# Patient Record
Sex: Female | Born: 1950 | Race: White | Hispanic: No | Marital: Married | Smoking: Current every day smoker
Health system: Southern US, Community
[De-identification: ages and names within clinical notes are randomized; demographics above are authoritative.]

## PROBLEM LIST (undated history)

## (undated) DIAGNOSIS — M419 Scoliosis, unspecified: Secondary | ICD-10-CM

## (undated) DIAGNOSIS — E039 Hypothyroidism, unspecified: Secondary | ICD-10-CM

## (undated) DIAGNOSIS — M797 Fibromyalgia: Secondary | ICD-10-CM

## (undated) DIAGNOSIS — K219 Gastro-esophageal reflux disease without esophagitis: Secondary | ICD-10-CM

## (undated) DIAGNOSIS — I10 Essential (primary) hypertension: Secondary | ICD-10-CM

## (undated) HISTORY — PX: FOOT SURGERY: SHX648

## (undated) HISTORY — PX: TONSILLECTOMY: SUR1361

## (undated) HISTORY — DX: Scoliosis, unspecified: M41.9

## (undated) HISTORY — DX: Gastro-esophageal reflux disease without esophagitis: K21.9

## (undated) HISTORY — DX: Fibromyalgia: M79.7

## (undated) HISTORY — DX: Essential (primary) hypertension: I10

## (undated) HISTORY — PX: ABDOMINAL HYSTERECTOMY: SUR658

## (undated) HISTORY — PX: BREAST LUMPECTOMY: SHX2

## (undated) HISTORY — DX: Hypothyroidism, unspecified: E03.9

## (undated) HISTORY — PX: ADENOIDECTOMY: SUR15

---

## 1998-10-17 ENCOUNTER — Inpatient Hospital Stay (HOSPITAL_COMMUNITY): Admission: EM | Admit: 1998-10-17 | Discharge: 1998-10-18 | Payer: Self-pay | Admitting: Emergency Medicine

## 1998-10-17 ENCOUNTER — Encounter: Payer: Self-pay | Admitting: Emergency Medicine

## 1998-10-18 ENCOUNTER — Encounter: Payer: Self-pay | Admitting: *Deleted

## 1998-11-12 ENCOUNTER — Observation Stay (HOSPITAL_COMMUNITY): Admission: RE | Admit: 1998-11-12 | Discharge: 1998-11-12 | Payer: Self-pay | Admitting: Surgery

## 1998-11-12 ENCOUNTER — Encounter (INDEPENDENT_AMBULATORY_CARE_PROVIDER_SITE_OTHER): Payer: Self-pay | Admitting: Specialist

## 1999-03-05 ENCOUNTER — Other Ambulatory Visit: Admission: RE | Admit: 1999-03-05 | Discharge: 1999-03-05 | Payer: Self-pay | Admitting: Obstetrics and Gynecology

## 1999-04-29 ENCOUNTER — Encounter: Admission: RE | Admit: 1999-04-29 | Discharge: 1999-04-29 | Payer: Self-pay | Admitting: *Deleted

## 1999-06-01 ENCOUNTER — Encounter: Admission: RE | Admit: 1999-06-01 | Discharge: 1999-06-01 | Payer: Self-pay | Admitting: *Deleted

## 1999-07-28 ENCOUNTER — Inpatient Hospital Stay (HOSPITAL_COMMUNITY): Admission: RE | Admit: 1999-07-28 | Discharge: 1999-07-30 | Payer: Self-pay | Admitting: *Deleted

## 1999-07-29 ENCOUNTER — Encounter: Payer: Self-pay | Admitting: Neurology

## 1999-08-13 ENCOUNTER — Ambulatory Visit (HOSPITAL_BASED_OUTPATIENT_CLINIC_OR_DEPARTMENT_OTHER): Admission: RE | Admit: 1999-08-13 | Discharge: 1999-08-13 | Payer: Self-pay | Admitting: *Deleted

## 2000-01-27 ENCOUNTER — Emergency Department (HOSPITAL_COMMUNITY): Admission: EM | Admit: 2000-01-27 | Discharge: 2000-01-27 | Payer: Self-pay | Admitting: Internal Medicine

## 2000-02-08 ENCOUNTER — Encounter: Payer: Self-pay | Admitting: Gastroenterology

## 2000-02-08 ENCOUNTER — Encounter: Admission: RE | Admit: 2000-02-08 | Discharge: 2000-02-08 | Payer: Self-pay | Admitting: Gastroenterology

## 2000-03-17 ENCOUNTER — Other Ambulatory Visit: Admission: RE | Admit: 2000-03-17 | Discharge: 2000-03-17 | Payer: Self-pay | Admitting: Obstetrics and Gynecology

## 2000-03-28 ENCOUNTER — Ambulatory Visit (HOSPITAL_COMMUNITY): Admission: RE | Admit: 2000-03-28 | Discharge: 2000-03-28 | Payer: Self-pay | Admitting: Gastroenterology

## 2000-03-28 ENCOUNTER — Encounter (INDEPENDENT_AMBULATORY_CARE_PROVIDER_SITE_OTHER): Payer: Self-pay | Admitting: *Deleted

## 2000-04-07 ENCOUNTER — Encounter: Payer: Self-pay | Admitting: Obstetrics and Gynecology

## 2000-04-07 ENCOUNTER — Encounter: Admission: RE | Admit: 2000-04-07 | Discharge: 2000-04-07 | Payer: Self-pay | Admitting: Obstetrics and Gynecology

## 2000-10-19 ENCOUNTER — Encounter: Payer: Self-pay | Admitting: *Deleted

## 2000-10-19 ENCOUNTER — Ambulatory Visit (HOSPITAL_COMMUNITY): Admission: RE | Admit: 2000-10-19 | Discharge: 2000-10-19 | Payer: Self-pay | Admitting: *Deleted

## 2002-10-18 ENCOUNTER — Observation Stay (HOSPITAL_COMMUNITY): Admission: EM | Admit: 2002-10-18 | Discharge: 2002-10-19 | Payer: Self-pay | Admitting: *Deleted

## 2002-10-18 ENCOUNTER — Encounter: Payer: Self-pay | Admitting: *Deleted

## 2002-10-19 ENCOUNTER — Encounter: Payer: Self-pay | Admitting: Family Medicine

## 2002-10-24 ENCOUNTER — Encounter: Admission: RE | Admit: 2002-10-24 | Discharge: 2002-10-24 | Payer: Self-pay | Admitting: Family Medicine

## 2004-08-23 ENCOUNTER — Encounter: Admission: RE | Admit: 2004-08-23 | Discharge: 2004-08-23 | Payer: Self-pay | Admitting: Obstetrics and Gynecology

## 2004-09-02 ENCOUNTER — Encounter: Admission: RE | Admit: 2004-09-02 | Discharge: 2004-09-02 | Payer: Self-pay | Admitting: Obstetrics and Gynecology

## 2005-05-04 ENCOUNTER — Encounter (INDEPENDENT_AMBULATORY_CARE_PROVIDER_SITE_OTHER): Payer: Self-pay | Admitting: Specialist

## 2005-05-04 ENCOUNTER — Ambulatory Visit (HOSPITAL_COMMUNITY): Admission: RE | Admit: 2005-05-04 | Discharge: 2005-05-04 | Payer: Self-pay | Admitting: *Deleted

## 2005-10-11 ENCOUNTER — Encounter: Admission: RE | Admit: 2005-10-11 | Discharge: 2005-10-11 | Payer: Self-pay | Admitting: Obstetrics and Gynecology

## 2007-02-08 ENCOUNTER — Encounter
Admission: RE | Admit: 2007-02-08 | Discharge: 2007-02-08 | Payer: Self-pay | Admitting: Physical Medicine & Rehabilitation

## 2007-03-14 ENCOUNTER — Ambulatory Visit: Payer: Self-pay | Admitting: Physical Medicine & Rehabilitation

## 2007-03-14 ENCOUNTER — Encounter
Admission: RE | Admit: 2007-03-14 | Discharge: 2007-06-12 | Payer: Self-pay | Admitting: Physical Medicine & Rehabilitation

## 2007-05-28 ENCOUNTER — Ambulatory Visit: Payer: Self-pay | Admitting: Physical Medicine & Rehabilitation

## 2007-07-19 ENCOUNTER — Encounter
Admission: RE | Admit: 2007-07-19 | Discharge: 2007-10-17 | Payer: Self-pay | Admitting: Physical Medicine & Rehabilitation

## 2007-09-10 ENCOUNTER — Ambulatory Visit: Payer: Self-pay | Admitting: Physical Medicine & Rehabilitation

## 2007-11-01 ENCOUNTER — Encounter
Admission: RE | Admit: 2007-11-01 | Discharge: 2007-11-02 | Payer: Self-pay | Admitting: Physical Medicine & Rehabilitation

## 2007-11-02 ENCOUNTER — Ambulatory Visit: Payer: Self-pay | Admitting: Physical Medicine & Rehabilitation

## 2008-01-31 ENCOUNTER — Encounter
Admission: RE | Admit: 2008-01-31 | Discharge: 2008-01-31 | Payer: Self-pay | Admitting: Physical Medicine & Rehabilitation

## 2010-03-14 ENCOUNTER — Encounter: Payer: Self-pay | Admitting: Obstetrics and Gynecology

## 2010-07-06 NOTE — Assessment & Plan Note (Signed)
Candice Oliver is back regarding fibromyalgia and left hip pain.  She did not  follow up with the [facility] trial as she had some things that came up  with her family and she frankly admitted to having lost track of sample  pack.  She stated left hip injection we performed at last visit was not  quite as helpful as it had been either.  She rates her pain at 7/10 and  describes it as sharp, dull, constant and aching.  The pain interferes  with general activity, relations with others, and enjoyment of life on a  moderate level.  Sleep is poor.  She easily wakes up once in the middle  of the night.  She has to take another Lortab.  It is usually the left  hip pain that wakes her.   REVIEW OF SYSTEMS:  Notable for bladder control problems, weakness,  trouble walking, depression, anxiety, waking, nausea, vomiting,  diarrhea, constipation, and reflux.  Full review is in the written  health and history section in the chart.   SOCIAL HISTORY:  As noted above.  Apparently, her husband and sister has  been sick and this has been weighing heavily on her.   PHYSICAL EXAMINATION:  VITAL SIGNS:  Blood pressure 145/97, pulse is 73,  respiratory rate 18, and she is satting 98% on room air.  GENERAL:  The patient is pleasant, alert and oriented x3.  Strength  remains 5/5 in all 4 extremities.  She has palpable trigger points still  in the neck and shoulders.  Left greater trochanter and the external  rotator of the hip are tender to palpitation today and  reproduced her  hip pain.  HEART:  Regular.  CHEST:  Clear.  ABDOMEN:  Soft and nontender.  She remains overweight.  NEUROLOGICAL:  Cognitively, she is intact with normal cranial nerve  exam.   ASSESSMENT:  1. Fibromyalgia syndrome.  2. Left leg length discrepancy.  3. Cervical and thoracic myofascial pain.  4. Left greater trochanter bursitis.  5. Obesity.  6. Headaches.  7. Anxiety and depression.  8. Irritable bowel syndrome.   PLAN:  1. We will  retry Savella for fibromyalgia symptoms.  2. Few Lortab for breakthrough pain.  3. We injected left hip posteriorly along the greater trochanter and      insertion of the rotators with 40 mg of Kenalog and 3 mL 1%      lidocaine.  The patient tolerated well.  Discussed aggressive      stretching and range of      motion which she is doing to a certain extent at the home.  4. I will see her back in about 2 months.      Ranelle Oyster, M.D.  Electronically Signed     ZTS/MedQ  D:  11/02/2007 13:12:15  T:  11/03/2007 02:20:15  Job #:  [id number]   cc:   Clydie Braun L. Hal Hope, M.D.  Fax: [phone]

## 2010-07-06 NOTE — Assessment & Plan Note (Signed)
HISTORY:  Candice Oliver is back regarding her fibromyalgia.  She missed her  last appointment after she placed it on the wrong date on her calendar.  She complains of more hip pain, particularly on the left side.  She had  a fall over the weekend, which exacerbated symptoms.  She has been busy  in the yard, working, exercising, is very active.  She has been canning  a lot of the vegetables, and her neck has become sore as a result.  She  rates her pain a 5/10.  She uses Lortab for breakthrough pain and feels  this is not helping as much as they once did.  She stopped Celebrex and  Cymbalta due to diarrhea.  Her irritable bowel and diarrhea symptoms  have been a bit better overall.  The patient states her pain is  interfering with general activity, in relations with others, and  enjoyment of life on a moderate-to-severe level.  The areas of pain  include left shoulder, the interscapular region, and hips.   REVIEW OF SYSTEMS:  Notable for above as well as some depression,  anxiety, nausea, diarrhea, constipation, and poor appetite.  Full review  is in the written health and history section of the chart.  Other  pertinent positives are above.   SOCIAL HISTORY:  As noted above.  She did lose her job which she feels  may have been a blessing in disguise.   PHYSICAL EXAMINATION:  VITAL SIGNS:  Blood pressure is 143/83, pulse is  97, respiratory rate 24, and she is sating 97% on room air.  GENERAL:  The patient is pleasant, alert, and oriented x3.  She remains  overweight.  She has pain in the left greater trochanter region with  palpation.  Strength generally is 5/5 with normal sensation.  She had  palpable trigger points along the left trapezius and left  sternocleidomastoid today with radiation into the chest and scapular  areas.  HEART:  Regular.  CHEST:  Clear.  ABDOMEN:  Soft and nontender.  NEURO:  Cranial nerve exam is intact.  Coordination is stable.   ASSESSMENT:  1. Fibromyalgia  syndrome.  2. Left leg length discrepancy.  3. Cervical and thoracic myofascial pain.  4. Left greater trochanter bursitis.  5. Obesity.  6. Headaches.  7. Anxiety with depression.  8. Irritable bowel syndrome.   PLAN:  1. Begin trial of Savella for fibromyalgia pain.  We will review the      facts and side effects today.  2. Encourage use of ReQuip 4-6 mg at nighttime for sleep symptoms.  If      this does not help, we will need to look at stopping or  trying a      different agent.  3. Refilled Lortab today.  4. After informed consent, reinjected the left hip with 40 mg of      Kenalog and 3 mL of Lidocaine.  5. We injected the left trapezius and sternocleidomastoid at two      locations with 2 mL of 1% Lidocaine trigger point injections.  6. Encouraged exercise.  Activity as tolerated.  7. Recommended low-residue fiber supplementation.  8. We will see her back in about a month.      Ranelle Oyster, M.D.  Electronically Signed     ZTS/MedQ  D:  09/10/2007 11:30:18  T:  09/11/2007 04:51:51  Job #:  [id number]   cc:   Clydie Braun L. Hal Hope, M.D.  Fax: [phone]

## 2010-07-06 NOTE — Assessment & Plan Note (Signed)
Candice Oliver is back regarding her fibromyalgia, neck and leg pain. She  liked the Requip and it has helped some of her restless leg symptoms and  sleep, although she ran out of her Halcion about a week and a half ago  and her sleep has been affected adversely. She stopped the Cymbalta  initially after we told her to hold off when she experienced lower  extremity swelling. She however resumed the medication because it helped  her substantially with her energy and mood. She uses Psychologist, sport and exercise as  prescribed. She has gone back on Lyrica for general pain, but more so  the left hip symptoms. She was not able to get into therapy due to a $50  co-pay being prohibitive.   The patient rates her pain as a 6/10 today. She describes it as a  burning, dull, constant and aching. Pain interferes with general  activity, relations with others and enjoyment of life on a moderate  level. Sleep is fair.   REVIEW OF SYSTEMS:  Notable for depression and anxiety, nausea,  diarrhea, some constipation, weight gain, bladder and bowel symptoms.  Other pertinent positives are listed above and full review is in written  Health and History section of the chart.   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   PHYSICAL EXAMINATION:  Blood pressure 147/89, pulse 88. Respiratory  rate: 18. She is satting 94% on room air. The patient is pleasant, alert  and oriented x3. Affect is bright and appropriate. She remains  overweight. She has some pain still in the lower cervical and upper  thoracic regions, left greater trochanter is significantly tender to  touch both anteriorly and posteriorly. She has intact strength at 5/5  with normal sensory examination. Cognitively she is appropriate.  HEART: Regular.  CHEST: Clear.  ABDOMEN: Soft and nontender.   ASSESSMENT:  1. Fibromyalgia syndrome.  2. Left leg length discrepancy, improved with heel lift.  3. Thoracic and lower  cervical myofascial pain.  4. Left greater trochanter bursitis.  5. Obesity.  6. History of headaches.  7. Anxiety and depression.   PLAN:  1. Will increase Requip to 1 mg to see if we can avoid restarting      Halcion.  2. Refill Lortab today #100.  3. Continue Cymbalta 60 mg daily.  4. After informed consent, we injected the left hip via the lateral      approach using 40 mg of Kenalog and 3 cc of 1% lidocaine. The      patient tolerated well. Advised ice. We also will start Celebrex      200 mg daily. She was given samples to start. I want her to obtain      some hip exercises from her therapy cohorts to work on the      hamstring musculature, quadriceps, adductors and tensor fascia      latae muscles.  5. I would like her to wean herself off of Lyrica due to ill effects.      I am not sure it is helping her in a general sense. I believe we      can treat the left hip as above.  6. Continue appropriate shoe wear and heel lift.  7. I will see her back in about two months time. I feel that we are      making some progress here.      Ranelle Oyster, M.D.  Electronically Signed  ZTS/MedQ  D:  04/11/2007 10:49:53  T:  04/11/2007 19:08:58  Job #:  [id number]   cc:   Clydie Braun L. Hal Hope, M.D.  Fax: [phone]

## 2010-07-06 NOTE — Assessment & Plan Note (Signed)
Candice Oliver is back regarding her fibromyalgia.  She did very well with her  left hip injection for about 1 to 1-1/2 months.  The pain seems to have  flared up again.  Requip has helped a bit with her sleep but she still  wakes up at night.  She complains of increasing headaches, particularly  in the frontal areas.  She has had some sinus issues along with those.  She is taking Mucinex for cough and as an expectorant.  She feels  overall more achy.  She has had 3-4 weeks of diarrhea.  She has had a  workup underway.  She sees a GI specialist tomorrow.   Incidentally, we resumed Cymbalta and started Celebrex last visit.  The  patient has stopped Lyrica.   The patient rates her pain as a 6-7 out of 10.  She describes it as  sharp, dull, constant, sometimes aching.  The pain interferes with  general activity, relations with others, enjoyment of life on a moderate  level.  Sleep is fair.   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   REVIEW OF SYSTEMS:  Notable for the above as well as depression,  anxiety, nausea, diarrhea, limb swelling, abdominal pain.   PHYSICAL EXAMINATION:  VITAL SIGNS:  Blood pressure is 142/93, pulse is  86, respiratory rate 18.  She is sating 94% on room air.  GENERAL:  The patient is pleasant, alert, and oriented x3.  She remains  overweight.  MUSCULOSKELETAL:  She has continued pain in the left greater trochanter  region, particularly to touch and range of motion today.  Strength  appeared at 5/5.  Normal sensation.  Cognitively she is appropriate.  HEART:  Regular.  CHEST:  Clear.  ABDOMEN:  Soft, nontender.   ASSESSMENT:  1. Fibromyalgia Synthroid.  2. Left leg length discrepancy.  3. Thoracic and lower cervical myofascial pain.  4. Left greater trochanter bursitis.  5. Obesity.  6. History of headaches.  7. Anxiety and depression.  8. Diarrhea most likely related to  irritable bowel syndrome and      exacerbation with current medication changes.   PLAN:  1. Hold Cymbalta and Celebrex today due to diarrhea.  2. We will increase Requip to 2 mg nightly.  The patient may take an      additional milligram to 2 milligrams if needed.  3. Refill Lortab #100.  4. After informed consent, we injected the left greater trochanter      using 40 mg of Kenalog and 3 mL 1% lidocaine.  The patient      tolerated well.  5. Discussed using over-the-counter antihistamines for potential sinus      allergies.  These certainly could be exacerbating headache      symptoms.  6. Await GI recommendations regarding her diarrhea.  7. I will see her back in 1 to 2 months' time.      Ranelle Oyster, M.D.  Electronically Signed     ZTS/MedQ  D:  05/28/2007 16:31:19  T:  05/28/2007 17:51:08  Job #:  [id number]   cc:   Clydie Braun L. Hal Hope, M.D.  Fax: [phone]

## 2010-07-09 NOTE — Discharge Summary (Signed)
[facility]  Patient:    Candice Oliver, Candice Oliver                    MRN: [id number] Adm. Date:  [id number] Disc. Date: [id number] Attending:  Feliciana Rossetti                           Discharge Summary  DISCHARGE DIAGNOSES: 1. Syncope and collapse. 2. Fibromyalgia. 3. Carpal tunnel syndrome. 4. Hypothyroidism. 5. Hypertension. 6. Elevated liver function tests.  REASON FOR HOSPITALIZATION:  Syncope and collapse.  HOSPITAL COURSE:  Patient was admitted to a general medical bed and consultation was obtained from Dr. Genene Churn. Love.  MRI was performed of brain and MRA of head, which were negative, without acute disease.  Patient was continued on her ______ 0.125 mg q.d., Remeron 15 mg q.h.s., Wellbutrin 150 mg b.i.d. and Maxzide 75 mg q.d.  She was hydrated cautiously.  Demerol and Phenergan were given IV as necessary for pain.  Erythrocyte sedimentation rate, ANA, CPK and ALT were obtained in addition to a comprehensive metabolic panel and a complete blood cell count.  All of this was unrevealing and patient had no further syncopal episodes and no presyncopal episodes.  CONDITION AT DISCHARGE:  Patient was well-appearing, ambulating with ease, stating she feel poorly but at her baseline.  Her vital signs were stable and normal.  FOLLOWUP:  Followup was with Dr. Feliciana Rossetti two weeks following discharge for a planned repeat of ______  LFTs. DD:  09/12/99 TD:  09/15/99 Job: [id number]

## 2010-07-09 NOTE — Op Note (Signed)
Candice Oliver, Candice Oliver             ACCOUNT NO.:  [id number]   MEDICAL RECORD NO.:  [id number]          PATIENT TYPE:  AMB   LOCATION:  ENDO                         FACILITY:  [facility]   PHYSICIAN:  Anselmo Rod, M.D.  DATE OF BIRTH:  [birth year]   DATE OF PROCEDURE:  05/05/2005  DATE OF DISCHARGE:                                 OPERATIVE REPORT   PROCEDURE PERFORMED:  Colonoscopy with multiple rectal biopsies.   ENDOSCOPIST:  Charna Elizabeth, M.D.   INSTRUMENT USED:  Olympus video colonoscope.   INDICATIONS FOR PROCEDURE:  A 60 year old white female with history of  change in bowel habits, mucoid stool and family history of colon cancer in  her mother and her maternal uncle, undergoing colonoscopy to rule out  colonic polyps, masses, etc.   PREPROCEDURE PREPARATION:  Informed consent was procured from the patient.  The patient was fasted for four hours prior to the procedure and prepped  with OsmoPrep pills the night of and the morning of the procedure.  The  risks and benefits of the procedure including a 10% miss rate for cancer or  polyps was discussed with the patient as well.   PREPROCEDURE PHYSICAL:  The patient had stable vital signs.  Neck supple.  Chest clear to auscultation.  S1 and S2 regular.  Abdomen soft with normal  bowel sounds.   DESCRIPTION OF PROCEDURE:  The patient was placed in left lateral decubitus  position and sedated with 100 mcg of fentanyl and 10 mg of Versed in slow  incremental doses.  Once the patient was adequately sedated and maintained  on low flow oxygen and continuous cardiac monitoring, the Olympus video  colonoscope was advanced from the rectum to the cecum.  The appendicular  orifice and ileocecal valve were clearly visualized and photographed.  There  was a large amount of residual stool in the colon.  Multiple washes were  done.  The patient's position was changed from the left lateral to supine  position with gentle application of abdominal  pressure to reach the cecal  base.  Three small sessile polyps were biopsied from the rectum.  Rectal  biopsies were done as there was some loss of vascular marking in this area,  to rule out proctitis as well.  Mild inflammatory changes were noted from 0  to 5 cm.  The rest of the exam up to the terminal ileum appeared normal.  Small lesions could be missed as the prep was poor.   IMPRESSION:  Three small sessile polyps biopsied from the rectum along with  rectal biopsies to rule out proctitis.  Otherwise unrevealing exam up to the  terminal ileum.   RECOMMENDATIONS:  1.  Await pathology results.  2.  Avoid all nonsteroidals for now.  3.  Outpatient followup in the next two weeks for further recommendations.      Anselmo Rod, M.D.  Electronically Signed     JNM/MEDQ  D:  05/05/2005  T:  05/06/2005  Job:  [id number]   cc:   Nilda Simmer, M.D.  Fax: [phone]

## 2010-07-09 NOTE — H&P (Signed)
[facility]  Patient:    Candice Oliver, Candice Oliver                    MRN: [id number] Adm. Date:  [id number] Disc. Date: [id number] Attending:  Feliciana Rossetti                         History and Physical  CHIEF COMPLAINT:  Passed out.  HISTORY OF PRESENT ILLNESS:  A 60 year old white female was walking today at which time she felt light headed and then fell to the ground. She incurred no trauma. She denied palpitations, chest pain, fevers, chills, nausea, vomiting, diarrhea, dysuria. She does endorse diffuse body aches, diffuse muscle aches and diffuse joint aches throughout her body. These included her bilateral shoulders, bilateral hips, bilateral knees, bilateral ankles, bilateral hands and again diffusely her musculature. She describes headaches that are typical in intensity and character and they are 10/10 throbbing occipital associated with photophobia and phonophobia and are associated with nausea and vomiting. She denies anxiety, bizarre thoughts. She denies rash. She does endorse a recent tick bite for which she presented to Urgent Care and Dr. Perrin Maltese treated her with doxycycline.  DATA:  GGT greater elevated in my office. Past GGT within normal limits. Recent sed rate at Urgent Care normal. ANA negative. CBC, diff and CMET within normal limits. Hepatitis panel negative.  PAST SURGICAL HISTORY:  Cholecystectomy, hysterectomy, lumpectomy, tonsillectomy, wrist surgery.  PAST MEDICAL HISTORY:  Fibromyalgia, hypothyroidism, hypertension.  FAMILY HISTORY:  No collagen vascular disease. No muscular dystrophy, no myasthenia gravida. No Parkinsons. No known neurologic disease.  REVIEW OF SYSTEMS:  No skin breakdown. No rash. No overseas travel.  SOCIAL HISTORY:  Lives with husband. No tobacco, no ethanol.  MEDICINES:  Unithroid 0.125, Wellbutrin 150 b.i.d., Remeron 15 q.h.s., Maxzide 75 q.d., ______ q.i.d., Imitrex injection p.r.n., Lorcet  p.r.n.  ALLERGIES:  PENICILLIN.  PHYSICAL EXAMINATION:  GENERAL:  Nontoxic in no acute distress.  HEENT:  Pupils equal round and reactive to light. Funduscopic exam reveals no exudate or hemorrhage and normal disk edges. Oropharynx without lesions. Mucous membranes are moist.  NECK:  Supple. No carotid bruits. Lymph node survey negative to neck and inguinal and axillary.  HEART:  Regular rate and rhythm, no murmurs, rubs or gallops.  LUNGS:  Clear to auscultation. Good symmetric air movement.  ABDOMEN:  Soft, no hepatosplenomegaly. Obese. No rebound, no guard. No guard, normal bowel sounds.  EXTREMITIES:  No clubbing, cyanosis. Minimal non-pitting edema that is symmetric and bilateral. No cords or Homans.  NEUROLOGIC:  Muscle strength within normal limits. Rapid alternating movement and gait within normal limits.  ASSESSMENT: 1. Syncope: Consider Holter monitor, consider cardiac evaluation. 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 2. Fibromyalgia:  Continue Wellbutrin and remeron. Consider discontinuation    of Wellbutrin and resume serotonin elevating drug given agitation. 3. Hypothyroidism: Unithroid. 4. Hypertension:  Maxzide. 5. Migraine headaches:  The patient has been to [facility].    Will obtain neurologic consultation. DD:  07/28/99 TD:  07/29/99 Job: [id number]

## 2010-07-09 NOTE — Consult Note (Signed)
[facility]  Patient:    Candice Oliver, Candice Oliver                    MRN: [id number] Proc. Date: 07/29/99 Adm. Date:  [id number] Attending:  Feliciana Rossetti                          Consultation Report  DATE OF BIRTH:  04/17/1950  REASON FOR CONSULTATION:  This 60 year old right handed white married female is admitted from the office by Dr. Quintella Reichert on the evening of July 28, 1999 and is now evaluated for episodes of presyncope.  HISTORY OF PRESENT ILLNESS:  Candice Oliver has a 3 year history of suspected fibromyalgia characterized by pain syndrome involving the upper and lower extremities as a muscle cramp of soft tissue type of pain with swelling in her joints. This has involved the right hand and wrist more recently and she has no known history of carpal tunnel syndrome though she has been on a thyroid replacement medication. She states that over the last 3 months, she has had almost daily episodes occurring as often as once to twice per day of standing dizziness characterized by light headed sensation without true spinning, nausea and vomiting, tinnitus, chest pain or palpitations. Yesterday while shopping, she noted the onset of this light headed sensation and had to sit down for relief and although there was near syncope, there was no definite loss of consciousness. There has been no history of micropsia or macropsia deja vu, strange odors or taste, head or neck trauma.  MEDICATIONS:  She has been on a series of medications for her disorder including Wellbutrin 150 mg p.o. b.i.d., Maxzide 75 one p.o. q.d., Remeron 15 mg 1 p.o. q.h.s., Unithroid 0.125 mg q.d., hydrocodone 3-4 per day and soma for muscle spasms.  PAST MEDICAL HISTORY:  She has had a known history in the past of high blood pressure. She has not had a known history of diabetes or heart disease according to the patient. She states that otherwise her health has been fairly good. 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 She does not smoke cigarettes. She does not drink alcohol.  LABORATORY DATA:  Her laboratory studies to date in the hospital have included hemoglobin of 12.5, hematocrit 37.2, white blood cell count 8000, platelets 387K, glucose 129, BUN 12, sodium 135, potassium 3.8, chloride 105, CO2 content 32, CK of 111.  PHYSICAL EXAMINATION:  GENERAL:  Revealed a well-developed, obese white female in no acute distress.  VITAL SIGNS:  Blood pressure lying right and left arm of 110/60, standing in the right arm 120/60, heart rate was 100 and regular.  NECK:  There were no carotid or supraclavicular bruits heard. The neck flexion and extension maneuvers were unremarkable.  HEENT:  She had a previous injury to her lip. Tympanic membranes were clear. There was no evidence of any easy bruisability or ecchymoses noted.  MENTAL STATUS:  She was alert and oriented x 3. She followed 1, 2, and 3 step commands. Her visual fields were full. The disks were flat. The extraocular movements full and cornuals were present. 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 Hearing was present. Air conduction was greater than bone conduction. The tongue was midline. The uvula was midline and gags were present. Sternocleidomastoid and trapezius were normal.  MOTOR EXAMINATION:  Revealed fair effort generally except for decreased effort in the right hand and arm  secondary to pain. There was no evidence of any atrophy seen, no fasciculations were present. It was felt that her examination was essentially nonfocal in terms of motor function. The sensory examination was intact to pinprick, light touch, joint position and vibration testing. The deep tendon reflexes were 2+ and plantar responses were down going. Gait examination was within normal limits. She could stand on her toes and she could stand on her heels.  Further information from the patient indicates episodes of confusion at  times occurring with a light headed sensation. This is not necessarily associated with macropsia or micropsia.  IMPRESSION: 1. Presyncope, code 780.2. 2. Confusion, code 298.9. 3. Fibromyalgia, code 729.1. 4. Carpal tunnel syndrome, code 354.0. 5. Thyroid disease.  PLAN:  Obtain an MRI study and EEG of the brain and try her on wrist splints. Consider a tilt table test to look for dysautonomia and signs of drop in blood pressure as a possible explanation for her dizziness. The possibility of a neurocardiogenic syncope needs to be considered. DD:  07/29/99 TD:  07/29/99 Job: [id number]

## 2010-07-09 NOTE — Procedures (Signed)
[place]. [facility]  Patient:    Candice Oliver, Candice Oliver                    MRN: [id number] Proc. Date: 03/28/00 Adm. Date:  [id number] Attending:  Charna Elizabeth CC:         Lacretia Leigh. Quintella Reichert, M.D.  Malachi Pro. Ambrose Mantle, M.D.   Procedure Report  DATE OF BIRTH:  02/25/1950.  PROCEDURE:  Colonoscopy with hot biopsy x 1.  ENDOSCOPIST:  Anselmo Rod, M.D.  INSTRUMENT USED:  Olympus video colonoscope.  INDICATION FOR PROCEDURE:  Guaiac-positive stool and a family history of colon cancer in a 60 year old white female.  Rule out colonic polyps, masses, hemorrhoids, etc.  PREPROCEDURE PREPARATION:  Informed consent was procured from the patient. The patient was fasted for eight hours prior to the procedure and prepped with a bottle of magnesium citrate and a gallon of NuLytely the night prior to the procedure.  PREPROCEDURE PHYSICAL:  VITAL SIGNS:  The patient had stable vital signs.  NECK:  Supple.  CHEST:  Clear to auscultation.  S1, S2 regular.  ABDOMEN:  Soft with normal abdominal bowel sounds.  DESCRIPTION OF PROCEDURE:  The patient was placed in the left lateral decubitus position and sedated with an additional 20 mg of Demerol and 2 mg of Versed intravenously.  Once the patient was adequately sedate and maintained on low-flow oxygen and continuous cardiac monitoring, the Olympus video colonoscope was advanced from the rectum to the cecum without difficulty.  The patient had a fairly good prep.  A small sessile polyp was hot biopsied from 20 cm.  The procedure was complete to the cecum.  The ileocecal valve and appendiceal orifice were clearly visualized and photographed.  No masses, polyps, erosions, or diverticula were seen in the cecum, right colon, transverse colon, or proximal left colon.  The patient had small internal and external hemorrhoid and tolerated the procedure well without complication.  IMPRESSION: 1. Essentially  healthy-appearing colon except for small sessile polyp, hot    biopsied at 20 cm. 2. Small, nonbleeding internal and external hemorrhoid.  RECOMMENDATIONS: 1. Await pathology results. 2. Avoid all nonsteroidals including aspirin. 3. Repeat LFTs today. 4. Further recommendation on an outpatient basis in the office in the next    four weeks. DD:  03/28/00 TD:  03/28/00 Job: [id number]

## 2012-05-28 ENCOUNTER — Other Ambulatory Visit: Payer: Self-pay

## 2012-05-28 DIAGNOSIS — Z1231 Encounter for screening mammogram for malignant neoplasm of breast: Secondary | ICD-10-CM

## 2012-05-30 ENCOUNTER — Ambulatory Visit: Payer: Self-pay

## 2012-06-27 ENCOUNTER — Ambulatory Visit: Payer: Self-pay

## 2012-06-27 ENCOUNTER — Ambulatory Visit
Admission: RE | Admit: 2012-06-27 | Discharge: 2012-06-27 | Disposition: A | Discharge status: Home / Self Care | Payer: Medicare Other | Source: Ambulatory Visit

## 2012-06-27 DIAGNOSIS — Z1231 Encounter for screening mammogram for malignant neoplasm of breast: Secondary | ICD-10-CM

## 2012-10-29 ENCOUNTER — Other Ambulatory Visit: Payer: Self-pay | Admitting: Family Medicine

## 2012-10-29 DIAGNOSIS — M549 Dorsalgia, unspecified: Secondary | ICD-10-CM

## 2012-11-03 ENCOUNTER — Other Ambulatory Visit: Payer: Medicare Other

## 2012-11-15 ENCOUNTER — Ambulatory Visit
Admission: RE | Admit: 2012-11-15 | Discharge: 2012-11-15 | Disposition: A | Discharge status: Home / Self Care | Payer: Medicare Other | Source: Ambulatory Visit | Attending: Family Medicine | Admitting: Family Medicine

## 2012-11-15 DIAGNOSIS — M549 Dorsalgia, unspecified: Secondary | ICD-10-CM

## 2014-06-23 ENCOUNTER — Other Ambulatory Visit: Payer: Self-pay

## 2014-06-23 DIAGNOSIS — Z1231 Encounter for screening mammogram for malignant neoplasm of breast: Secondary | ICD-10-CM

## 2014-06-23 DIAGNOSIS — Z803 Family history of malignant neoplasm of breast: Secondary | ICD-10-CM

## 2014-06-25 ENCOUNTER — Ambulatory Visit
Admission: RE | Admit: 2014-06-25 | Discharge: 2014-06-25 | Disposition: A | Discharge status: Home / Self Care | Payer: Medicare PPO | Source: Ambulatory Visit

## 2014-06-25 ENCOUNTER — Ambulatory Visit: Payer: Medicare Other

## 2014-06-25 ENCOUNTER — Encounter (INDEPENDENT_AMBULATORY_CARE_PROVIDER_SITE_OTHER): Payer: Self-pay

## 2014-06-25 DIAGNOSIS — Z1231 Encounter for screening mammogram for malignant neoplasm of breast: Secondary | ICD-10-CM

## 2014-06-25 DIAGNOSIS — Z803 Family history of malignant neoplasm of breast: Secondary | ICD-10-CM

## 2015-05-04 ENCOUNTER — Other Ambulatory Visit: Payer: Self-pay | Admitting: Family Medicine

## 2015-05-04 DIAGNOSIS — R110 Nausea: Secondary | ICD-10-CM

## 2015-05-04 DIAGNOSIS — R102 Pelvic and perineal pain: Secondary | ICD-10-CM

## 2015-05-08 ENCOUNTER — Other Ambulatory Visit: Payer: Medicare PPO

## 2016-03-31 ENCOUNTER — Other Ambulatory Visit: Payer: Self-pay | Admitting: Obstetrics and Gynecology

## 2016-03-31 DIAGNOSIS — Z1231 Encounter for screening mammogram for malignant neoplasm of breast: Secondary | ICD-10-CM

## 2016-04-04 ENCOUNTER — Ambulatory Visit: Payer: Medicare PPO

## 2016-04-26 ENCOUNTER — Other Ambulatory Visit (HOSPITAL_BASED_OUTPATIENT_CLINIC_OR_DEPARTMENT_OTHER): Payer: Self-pay

## 2016-04-26 DIAGNOSIS — G4730 Sleep apnea, unspecified: Secondary | ICD-10-CM

## 2016-05-04 DIAGNOSIS — M65341 Trigger finger, right ring finger: Secondary | ICD-10-CM | POA: Insufficient documentation

## 2016-05-18 ENCOUNTER — Ambulatory Visit (HOSPITAL_BASED_OUTPATIENT_CLINIC_OR_DEPARTMENT_OTHER): Payer: Medicare Other | Attending: Family Medicine | Admitting: Internal Medicine

## 2016-05-18 DIAGNOSIS — Z6835 Body mass index (BMI) 35.0-35.9, adult: Secondary | ICD-10-CM | POA: Insufficient documentation

## 2016-05-18 DIAGNOSIS — G4733 Obstructive sleep apnea (adult) (pediatric): Secondary | ICD-10-CM | POA: Insufficient documentation

## 2016-05-18 DIAGNOSIS — E669 Obesity, unspecified: Secondary | ICD-10-CM | POA: Diagnosis not present

## 2016-05-18 DIAGNOSIS — R0989 Other specified symptoms and signs involving the circulatory and respiratory systems: Secondary | ICD-10-CM | POA: Diagnosis present

## 2016-05-18 DIAGNOSIS — I10 Essential (primary) hypertension: Secondary | ICD-10-CM | POA: Insufficient documentation

## 2016-05-18 DIAGNOSIS — G4730 Sleep apnea, unspecified: Secondary | ICD-10-CM

## 2016-05-21 DIAGNOSIS — G4730 Sleep apnea, unspecified: Secondary | ICD-10-CM | POA: Diagnosis not present

## 2016-05-21 NOTE — Procedures (Signed)
  Patient Name: Candice Oliver, [name] Date: 05/18/2016 Gender: Female D.O.B: April 24, 1950 Age (years): 65 Referring Provider: Windle Guard Height (inches): 60 Interpreting Physician: Jetty Duhamel MD, ABSM Weight (lbs): 180 RPSGT: Ulyess Mort BMI: 35 MRN: [id number] Neck Size: 14.00 CLINICAL INFORMATION Sleep Study Type: NPSG  Indication for sleep study: Fatigue, Hypertension, Obesity, Snoring, Witnessed Apneas  Epworth Sleepiness Score: 3  SLEEP STUDY TECHNIQUE As per the AASM Manual for the Scoring of Sleep and Associated Events v2.3 (April 2016) with a hypopnea requiring 4% desaturations.  The channels recorded and monitored were frontal, central and occipital EEG, electrooculogram (EOG), submentalis EMG (chin), nasal and oral airflow, thoracic and abdominal wall motion, anterior tibialis EMG, snore microphone, electrocardiogram, and pulse oximetry.  MEDICATIONS Medications self-administered by patient taken the night of the study : none reported  SLEEP ARCHITECTURE The study was initiated at 10:08:50 PM and ended at 4:48:23 AM.  Sleep onset time was 19.8 minutes and the sleep efficiency was 89.0%. The total sleep time was 355.5 minutes.  Stage REM latency was 174.5 minutes.  The patient spent 11.25% of the night in stage N1 sleep, 75.39% in stage N2 sleep, 0.00% in stage N3 and 13.36% in REM.  Alpha intrusion was absent.  Supine sleep was 6.85%.  RESPIRATORY PARAMETERS The overall apnea/hypopnea index (AHI) was 7.1 per hour. There were 7 total apneas, including 1 obstructive, 6 central and 0 mixed apneas. There were 35 hypopneas and 42 RERAs.  The AHI during Stage REM sleep was 17.7 per hour.  AHI while supine was 9.9 per hour.  The mean oxygen saturation was 93.58%. The minimum SpO2 during sleep was 86.00%.  Soft snoring was noted during this study.  CARDIAC DATA The 2 lead EKG demonstrated sinus rhythm. The mean heart rate was 68.21 beats per minute.  Other EKG findings include: None.  LEG MOVEMENT DATA The total PLMS were 28 with a resulting PLMS index of 4.73. Associated arousal with leg movement index was 0.7 .  IMPRESSIONS - Mild obstructive sleep apnea occurred during this study (AHI = 7.1/h). - No significant central sleep apnea occurred during this study (CAI = 1.0/h). - Mild oxygen desaturation was noted during this study (Min O2 = 86.00%). - The patient snored with Soft snoring volume. - No cardiac abnormalities were noted during this study. - Clinically significant periodic limb movements did not occur during sleep. No significant associated arousals.  DIAGNOSIS - Obstructive Sleep Apnea (327.23 [G47.33 ICD-10]  RECOMMENDATIONS - Very mild obstructive sleep apnea. Return to provider to discuss treatment options.  - Be careful with alcohol, sedatives and other CNS depressants that may worsen sleep apnea and disrupt normal sleep architecture. - Sleep hygiene should be reviewed to assess factors that may improve sleep quality. - Weight management and regular exercise should be initiated or continued if appropriate.  [Electronically signed] 05/21/2016 10:32 AM  Jetty Duhamel MD, ABSM Diplomate, American Board of Sleep Medicine   NPI: [id number]  Waymon Budge Diplomate, American Board of Sleep Medicine  ELECTRONICALLY SIGNED ON:  05/21/2016, 10:32 AM [facility] PH: [phone]   FX: [phone] ACCREDITED BY THE AMERICAN ACADEMY OF SLEEP MEDICINE

## 2016-06-13 ENCOUNTER — Encounter (HOSPITAL_BASED_OUTPATIENT_CLINIC_OR_DEPARTMENT_OTHER): Payer: Medicare PPO

## 2016-09-08 ENCOUNTER — Other Ambulatory Visit: Payer: Self-pay | Admitting: Family Medicine

## 2016-09-08 ENCOUNTER — Other Ambulatory Visit: Payer: Self-pay

## 2016-09-08 DIAGNOSIS — R7989 Other specified abnormal findings of blood chemistry: Secondary | ICD-10-CM

## 2016-09-08 DIAGNOSIS — R945 Abnormal results of liver function studies: Secondary | ICD-10-CM

## 2016-09-20 ENCOUNTER — Other Ambulatory Visit: Payer: Medicare Other

## 2016-09-26 DIAGNOSIS — G5603 Carpal tunnel syndrome, bilateral upper limbs: Secondary | ICD-10-CM | POA: Insufficient documentation

## 2016-09-27 ENCOUNTER — Other Ambulatory Visit: Payer: Medicare Other

## 2016-11-29 ENCOUNTER — Other Ambulatory Visit: Payer: Self-pay | Admitting: Family Medicine

## 2016-11-29 DIAGNOSIS — R945 Abnormal results of liver function studies: Secondary | ICD-10-CM

## 2016-11-29 DIAGNOSIS — R7989 Other specified abnormal findings of blood chemistry: Secondary | ICD-10-CM

## 2016-12-06 ENCOUNTER — Other Ambulatory Visit: Payer: Medicare Other

## 2016-12-15 ENCOUNTER — Other Ambulatory Visit: Payer: Medicare Other

## 2016-12-27 ENCOUNTER — Other Ambulatory Visit: Payer: Medicare Other

## 2016-12-30 ENCOUNTER — Institutional Professional Consult (permissible substitution): Payer: Medicare Other | Admitting: Pulmonary Disease

## 2017-01-25 ENCOUNTER — Other Ambulatory Visit: Payer: Medicare Other

## 2017-02-02 ENCOUNTER — Other Ambulatory Visit: Payer: Medicare Other

## 2017-02-09 ENCOUNTER — Other Ambulatory Visit: Payer: Medicare Other

## 2018-09-13 DIAGNOSIS — I10 Essential (primary) hypertension: Secondary | ICD-10-CM | POA: Insufficient documentation

## 2018-09-13 DIAGNOSIS — M419 Scoliosis, unspecified: Secondary | ICD-10-CM | POA: Insufficient documentation

## 2018-09-13 DIAGNOSIS — M797 Fibromyalgia: Secondary | ICD-10-CM | POA: Insufficient documentation

## 2018-09-14 ENCOUNTER — Other Ambulatory Visit: Payer: Self-pay

## 2018-09-14 ENCOUNTER — Ambulatory Visit: Payer: Medicare Other | Admitting: Podiatry

## 2018-09-14 ENCOUNTER — Ambulatory Visit (INDEPENDENT_AMBULATORY_CARE_PROVIDER_SITE_OTHER): Payer: Medicare Other

## 2018-09-14 ENCOUNTER — Encounter: Payer: Self-pay | Admitting: Podiatry

## 2018-09-14 VITALS — Temp 97.8°F

## 2018-09-14 DIAGNOSIS — M7662 Achilles tendinitis, left leg: Secondary | ICD-10-CM

## 2018-09-14 MED ORDER — METHYLPREDNISOLONE 4 MG PO TBPK
ORAL_TABLET | ORAL | 0 refills | Status: DC
Start: 2018-09-14 — End: 2019-06-03

## 2018-09-18 ENCOUNTER — Telehealth: Payer: Self-pay | Admitting: *Deleted

## 2018-09-18 NOTE — Telephone Encounter (Signed)
Pt called states the compression sock Dr. Amalia Hailey gave last week is making the tendon hurt worse and she took it off. 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

## 2018-09-18 NOTE — Telephone Encounter (Signed)
I called pt and told her to rest, ice and elevate until she could get in to the [place] office for the CAM boot tomorrow.

## 2018-09-20 NOTE — Telephone Encounter (Signed)
Patient picked up boot in [place] office

## 2018-09-21 NOTE — Progress Notes (Signed)
   HPI: 68 year old female presenting today as a new patient with a chief complaint of constant dull, aching pain noted to the left Achilles tendon that began 5-6 months ago. She states the pain is worse by the end of the day after being on the foot. She has not had any treatment for the symptoms. She denies heel pain. Patient is here for further evaluation and treatment.   No past medical history on file.    Physical Exam: General: The patient is alert and oriented x3 in no acute distress.  Dermatology: Skin is warm, dry and supple bilateral lower extremities. Negative for open lesions or macerations.  Vascular: Palpable pedal pulses bilaterally. No edema or erythema noted. Capillary refill within normal limits.  Neurological: Epicritic and protective threshold grossly intact bilaterally.   Musculoskeletal Exam: Pain on palpation noted to the posterior tubercle of the left calcaneus at the insertion of the Achilles tendon consistent with retrocalcaneal bursitis. Palpable nodule noted approximately 5 cm proximal to insertion with pain with palpation. Range of motion within normal limits. Muscle strength 5/5 in all muscle groups bilateral lower extremities.  Radiographic Exam:  Posterior calcaneal spur noted to the respective calcaneus on lateral view. No fracture or dislocation noted. Normal osseous mineralization noted.     Assessment: 1. Insertional Achilles tendinitis left 2. Retrocalcaneal bursitis   Plan of Care:  1. Patient was evaluated. Radiographs were reviewed today. 2. Injection of 0.5 mL Celestone Soluspan injected into the retrocalcaneal bursa. Care was taken to avoid direct injection into the Achilles tendon. 3. Compression anklet dispensed.  4. Prescription for Medrol Dose Pak provided to patient. 5. Order for physical therapy placed at [place].  6. Return to clinic in 4 weeks.    Edrick Kins, DPM [facility]  Dr. Edrick Kins,  [address]                                        [place], [place] [address]                Office [phone]  Fax [phone]

## 2018-09-25 ENCOUNTER — Other Ambulatory Visit: Payer: Self-pay | Admitting: Family Medicine

## 2018-09-25 DIAGNOSIS — Z1231 Encounter for screening mammogram for malignant neoplasm of breast: Secondary | ICD-10-CM

## 2018-10-09 ENCOUNTER — Ambulatory Visit: Payer: Medicare Other | Admitting: Podiatry

## 2018-10-30 ENCOUNTER — Ambulatory Visit: Payer: Medicare Other | Admitting: Podiatry

## 2018-11-07 ENCOUNTER — Ambulatory Visit: Payer: Medicare Other

## 2018-11-09 ENCOUNTER — Ambulatory Visit: Payer: Medicare Other | Admitting: Podiatry

## 2018-12-20 ENCOUNTER — Ambulatory Visit: Payer: Medicare Other

## 2019-06-03 ENCOUNTER — Other Ambulatory Visit: Payer: Self-pay

## 2019-06-03 ENCOUNTER — Ambulatory Visit: Payer: Medicare Other | Admitting: Internal Medicine

## 2019-06-03 ENCOUNTER — Encounter: Payer: Self-pay | Admitting: Internal Medicine

## 2019-06-03 VITALS — BP 148/88 | HR 85 | Ht 59.75 in | Wt 134.0 lb

## 2019-06-03 DIAGNOSIS — E039 Hypothyroidism, unspecified: Secondary | ICD-10-CM | POA: Diagnosis not present

## 2019-06-03 LAB — T4, FREE: Free T4: 0.91 ng/dL (ref 0.60–1.60)

## 2019-06-03 LAB — TSH: TSH: 2.52 u[IU]/mL (ref 0.35–4.50)

## 2019-06-03 MED ORDER — LEVOTHYROXINE SODIUM 75 MCG PO TABS: 75.0000 ug | ORAL_TABLET | Freq: Every day | ORAL | 3 refills | Status: DC

## 2019-06-03 NOTE — Patient Instructions (Signed)
Please continue levothyroxine 88 mcg 6/7 days.  Take the thyroid hormone every day, with water, at least 30 minutes before breakfast, separated by at least 4 hours from: - acid reflux medications - calcium - iron - multivitamins  Move calcium and Omeprazole at least 4h after Levothyroxine.  Please stop at the lab.  Please come back for a follow-up appointment in 4 months.

## 2019-06-03 NOTE — Progress Notes (Signed)
Patient ID: Candice Oliver, female   DOB: 03-06-1950, 69 y.o.   MRN: [id number]   This visit occurred during the SARS-CoV-2 public health emergency.  Safety protocols were in place, including screening questions prior to the visit, additional usage of staff PPE, and extensive cleaning of exam room while observing appropriate contact time as indicated for disinfecting solutions.   HPI  Candice Oliver is a 69 y.o.-year-old female, referred by Dr. Collene Mares, for management of hypothyroidism.  Pt. has been dx with hypothyroidism in 2018 >> initially on 100 mcg daily, then decreased to 85 mcg daily (100 mcg 6 out of 7 days), then on Levothyroxine 88 mcg, then decreased to 75 mcg (88 mcg 6 out of 7 days a week) (last change: 03/2019).  She takes the thyroid hormone: - fasting - with sweet tea - separated by >1h from b'fast  - + calcium-vitamin D at least 2h after LT4 - no iron, multivitamins  - on Omeprazole 40 mg daily 2h after LT4  I reviewed pt's thyroid tests: 04/26/2019 (Dr. Lorie Apley office): TSH 0.273  01/09/2019: TSH 0.258  12/18/2018: TSH 0.277 (0.45-4.5) No results found for: TSH, FREET4, T3FREE  Antithyroid antibodies: No results found for: THGAB No components found for: TPOAB  Pt describes: - weight loss - in last 6 mo, she lost 40 lbs! 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 - fatigue - diarrhea - now resolved - hair loss  But denies: - cold intolerance - depression - constipation - dry skin  Pt denies feeling nodules in neck, hoarseness, dysphagia/odynophagia, SOB with lying down.  She has + FH of thyroid disorders in: M - hypothyroidism. No FH of thyroid cancer.  No h/o radiation tx to head or neck. No recent use of iodine supplements. She took steroids in the past, not recently (08/2018). She is on several supplements: Vitamin D3 with calcium, vitamin C, vitamin D, Juice Plus fruit and veggie caps.  Pt. also has a history of HTN, GERD,  fibromyalgia, scoliosis, IBS  ROS: Constitutional: + see HPI, + poor sleep Eyes: no blurry vision, no xerophthalmia ENT: no sore throat, See HPI Cardiovascular: no CP/SOB/palpitations/leg swelling Respiratory: no cough/SOB Gastrointestinal: no N/V/+ D/+ C/ + acid reflux Musculoskeletal: + muscle/ no joint aches Skin: no rashes Neurological: no tremors/numbness/tingling/dizziness, + HA Psychiatric: no depression/anxiety  PMH: - HTN - GERD - fibromyalgia - scoliosis - IBS  Social History   Socioeconomic History  . Marital status: Single    Spouse name: Not on file  . Number of children: 45: 81 years old-05/2018  . Years of education: Not on file  . Highest education level: Not on file  Occupational History  . Not on file  Tobacco Use  . Smoking status: Current Every Day Smoker     0.5 PPD  . Smokeless tobacco: Never Used  Substance and Sexual Activity  . Alcohol use: Yes    Comment: occasional drink  . Drug use: Never  . Sexual activity: Not on file  Other Topics Concern  . Not on file  Social History Narrative  . Not on file   Social Determinants of Health   Financial Resource Strain:   . Difficulty of Paying Living Expenses:   Food Insecurity:   . Worried About Charity fundraiser in the Last Year:   . Arboriculturist in the Last Year:   Transportation Needs:   . Film/video editor (Medical):   Marland Kitchen Lack of  Transportation (Non-Medical):   Physical Activity:   . Days of Exercise per Week:   . Minutes of Exercise per Session:   Stress:   . Feeling of Stress :   Social Connections:   . Frequency of Communication with Friends and Family:   . Frequency of Social Gatherings with Friends and Family:   . Attends Religious Services:   . Active Member of Clubs or Organizations:   . Attends Banker Meetings:   Marland Kitchen Marital Status:   Intimate Partner Violence:   . Fear of Current or Ex-Partner:   . Emotionally Abused:   Marland Kitchen Physically Abused:   .  Sexually Abused:    Current Outpatient Medications on File Prior to Visit  Medication Sig Dispense Refill  . atenolol (TENORMIN) 50 MG tablet atenolol 50 mg tablet    . hydrochlorothiazide (HYDRODIURIL) 25 MG tablet hydrochlorothiazide 25 mg tablet    . HYDROcodone-Acetaminophen 10-300 MG TABS Take by mouth.    . levothyroxine (SYNTHROID) 88 MCG tablet Take 88 mcg by mouth daily.    Marland Kitchen omeprazole (PRILOSEC) 40 MG capsule Take 40 mg by mouth daily.     No current facility-administered medications on file prior to visit.   Allergies  Allergen Reactions  . Penicillins Rash   No family history on file.  PE: BP (!) 148/88   Pulse 85   Ht 4' 11.75" (1.518 m)   Wt 134 lb (60.8 kg)   SpO2 97%   BMI 26.39 kg/m  Wt Readings from Last 3 Encounters:  06/03/19 134 lb (60.8 kg)  05/18/16 180 lb (81.6 kg)   Constitutional: overweight, in NAD Eyes: PERRLA, EOMI, no exophthalmos ENT: moist mucous membranes, no thyromegaly, no cervical lymphadenopathy Cardiovascular: RRR, + 1/6 SEM, no RG Respiratory: CTA B Gastrointestinal: abdomen soft, NT, ND, BS+ Musculoskeletal: no deformities, strength intact in all 4 Skin: moist, warm, no rashes Neurological: + Mild tremor with outstretched hands, DTR normal in all 4  ASSESSMENT: 1.  Uncontrolled hypothyroidism  PLAN:  1. Patient with long-standing hypothyroidism, on levothyroxine therapy.  Her doses of levothyroxine have been changed frequently in the last few months.  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  She mentions that she initially weighed 220 pounds and lost down to 180 but after she got this job, which is very active, she was able to lose 40 pounds.  We discussed that this is probably the reason why her levothyroxine requirement decreased.   - She is currently on the equivalent of 75 mcg of levothyroxine daily (88 mcg 6 out of 7 days).  On this dose,  she had a TSH checked by Dr. Loreta Ave a month ago and it was still suppressed (please see HPI). - she appears euthyroid other than the weight loss.  She does have mild tremors, though. - she does not appear to have a goiter, thyroid nodules, or neck compression symptoms - We discussed about correct intake of levothyroxine, fasting, with water, separated by at least 30 minutes from breakfast, and separated by more than 4 hours from calcium, iron, multivitamins, acid reflux medications (PPIs).  She is taking it correctly, however, she takes her calcium + vitamin D and her PPI only 2 hours after levothyroxine, instead of 4 hours.  We discussed about moving these at least 4 hours later - will check thyroid tests today: TSH, free T4.  If the tests are normal,  I will switch her to 75 mcg levothyroxine daily.  If the TSH is still suppressed, she may need to alternate between 50 and 75 mcg every other day.  I do not expect that she will need much less levothyroxine, unless her thyroid function partially recovered after her weight loss - If labs today are abnormal, she will need to return in ~6 weeks for repeat labs - Otherwise, I will see her back in 4 months  Component     Latest Ref Rng & Units 06/03/2019  TSH     0.35 - 4.50 uIU/mL 2.52  T4,Free(Direct)     0.60 - 1.60 ng/dL 5.70   Labs normal now.  I will send a prescription for 75 mcg of levothyroxine to her pharmacy and repeat the tests in 1.5 months, after she separates her calcium and PPIs by more than 4 hours from levothyroxine.  Carlus Pavlov,  MD PhD [facility]

## 2019-06-05 ENCOUNTER — Telehealth: Payer: Self-pay

## 2019-06-05 NOTE — Telephone Encounter (Signed)
Notified patient of message from Dr. Gherghe, patient expressed understanding and agreement. No further questions.  

## 2019-06-05 NOTE — Telephone Encounter (Signed)
-----   Message from Carlus Pavlov, MD sent at 06/03/2019  5:04 PM EDT ----- Efraim Kaufmann, can you please call pt: Labs normal now.  I sent a prescription for 75 mcg of levothyroxine to her pharmacy (please advise her to take her every day) and we will repeat the tests in 1.5 months, after she separates her calcium and PPIs by more than 4 hours from levothyroxine.

## 2019-09-26 ENCOUNTER — Other Ambulatory Visit: Payer: Self-pay | Admitting: Family Medicine

## 2019-09-26 DIAGNOSIS — Z1231 Encounter for screening mammogram for malignant neoplasm of breast: Secondary | ICD-10-CM

## 2019-10-14 ENCOUNTER — Ambulatory Visit: Payer: Medicare Other

## 2019-10-22 ENCOUNTER — Ambulatory Visit: Payer: Medicare Other | Admitting: Internal Medicine

## 2019-11-25 ENCOUNTER — Ambulatory Visit: Payer: Medicare Other

## 2019-12-13 ENCOUNTER — Other Ambulatory Visit: Payer: Self-pay

## 2019-12-13 ENCOUNTER — Ambulatory Visit
Admission: RE | Admit: 2019-12-13 | Discharge: 2019-12-13 | Disposition: A | Discharge status: Home / Self Care | Payer: Medicare Other | Source: Ambulatory Visit | Attending: Family Medicine | Admitting: Family Medicine

## 2019-12-13 DIAGNOSIS — Z1231 Encounter for screening mammogram for malignant neoplasm of breast: Secondary | ICD-10-CM

## 2019-12-25 ENCOUNTER — Ambulatory Visit (INDEPENDENT_AMBULATORY_CARE_PROVIDER_SITE_OTHER): Payer: Medicare Other | Admitting: Allergy and Immunology

## 2019-12-25 ENCOUNTER — Telehealth: Payer: Self-pay

## 2019-12-25 ENCOUNTER — Encounter: Payer: Self-pay | Admitting: Allergy and Immunology

## 2019-12-25 ENCOUNTER — Other Ambulatory Visit: Payer: Self-pay

## 2019-12-25 VITALS — BP 130/86 | HR 64 | Resp 8 | Ht 60.0 in | Wt 140.6 lb

## 2019-12-25 DIAGNOSIS — F17210 Nicotine dependence, cigarettes, uncomplicated: Secondary | ICD-10-CM

## 2019-12-25 DIAGNOSIS — K219 Gastro-esophageal reflux disease without esophagitis: Secondary | ICD-10-CM

## 2019-12-25 DIAGNOSIS — G4720 Circadian rhythm sleep disorder, unspecified type: Secondary | ICD-10-CM

## 2019-12-25 DIAGNOSIS — J3089 Other allergic rhinitis: Secondary | ICD-10-CM

## 2019-12-25 DIAGNOSIS — R011 Cardiac murmur, unspecified: Secondary | ICD-10-CM

## 2019-12-25 DIAGNOSIS — G43909 Migraine, unspecified, not intractable, without status migrainosus: Secondary | ICD-10-CM

## 2019-12-25 DIAGNOSIS — J301 Allergic rhinitis due to pollen: Secondary | ICD-10-CM | POA: Diagnosis not present

## 2019-12-25 MED ORDER — FAMOTIDINE 40 MG PO TABS: 40.0000 mg | ORAL_TABLET | Freq: Every day | ORAL | 5 refills | Status: DC

## 2019-12-25 MED ORDER — AZELASTINE HCL 0.1 % NA SOLN: NASAL | 5 refills | Status: AC

## 2019-12-25 MED ORDER — CYPROHEPTADINE HCL 4 MG PO TABS: ORAL_TABLET | ORAL | 5 refills | Status: DC

## 2019-12-25 NOTE — Telephone Encounter (Signed)
Called and informed patient that she is scheduled for Echo at [facility] on Friday, November 5th, needing to check-in at the Outpatient center at 12:30pm. I have faxed order form to [facility].    Note: No prior authorization needed per insurance.  Call reference # [id number]

## 2019-12-25 NOTE — Progress Notes (Signed)
- [place] - [place] - [place] -    Dear Dr. Jeannetta Nap,  Thank you for referring Candice Oliver to the [facility] [place] on 12/25/2019.   Below is a summation of this patient's evaluation and recommendations.  Thank you for your referral. I will keep you informed about this patient's response to treatment.   If you have any questions please do not hesitate to contact me.   Sincerely,  Candice Priest, MD Allergy / Immunology [facility]   ______________________________________________________________________    NEW PATIENT NOTE  Referring Provider: Kaleen Mask, * Primary Provider: Kaleen Mask, MD Date of office visit: 12/25/2019    Subjective:   Chief Complaint:  Candice Oliver (DOB: 1950-02-28) is a 69 y.o. female who presents to the clinic on 12/25/2019 with a chief complaint of Sinus Problem .     HPI: [name] presents to this clinic in evaluation of allergies.  She states that since 1981 she has had problems with her sinuses and allergies.  This has been a progressive issue and it mostly presents during the fall and spring but she has problems throughout the entire year.  She complains of some nasal congestion and some occasional sneezing but no inability to smell or taste.  This occurs even though she consistently uses her Flonase and Claritin and Mucinex.  Even though she does have these respiratory tract symptoms her big issue is headache.  She has a frontal headache that is throbbing and has now assumed a chronic daily headache pattern over the course of the past year without any scotoma, dizziness, or nausea.  She has tried hydrocodone to treat this headache which does not help.  No treatment to date has helped this issue.  In conjunction with her headache she has very bad sleep.  She is taking melatonin for sleep.  She is up 5 times per  night.  She was given gabapentin which helps her initiation insomnia but has not helped her fractured sleep.  She does drink 3 to 6 glasses of tea with her last glass late evening.  She also appears to have a tremendous amount of postnasal drip and throat clearing and raspy voice.  She does have reflux disease for which she is using omeprazole which she thinks works quite well.  As noted above she drinks 3-6 caffeinated drinks per day.  She smokes tobacco at a rate of three-quarter pack per day secondary to stress.  She has a history of dry mouth but she does not think that she has had a history of dry eye.  She has received 2 Pfizer Covid vaccines and a flu vaccine and a Pneumovax this year.  Past Medical History:  Diagnosis Date  . Acid reflux   . Fibromyalgia   . High blood pressure   . Hypothyroidism   . Scoliosis     Past Surgical History:  Procedure Laterality Date  . ABDOMINAL HYSTERECTOMY    . ADENOIDECTOMY    . BREAST LUMPECTOMY Right   . FOOT SURGERY Left    5th metatarsal- screw  . TONSILLECTOMY      Allergies as of 12/25/2019      Reactions   Penicillins Rash      Medication List      atenolol 50 MG tablet Commonly known as: TENORMIN atenolol 50 mg tablet   CALCIUM/VITAMIN D PO Take by mouth daily.   FISH  OIL PO Take by mouth daily.   Flonase 50 MCG/ACT nasal spray Generic drug: fluticasone Place 2 sprays into both nostrils daily.   gabapentin 300 MG capsule Commonly known as: NEURONTIN Take 300 mg by mouth 3 (three) times daily.   hydrochlorothiazide 25 MG tablet Commonly known as: HYDRODIURIL hydrochlorothiazide 25 mg tablet   HYDROcodone-acetaminophen 10-325 MG tablet Commonly known as: NORCO Take by mouth.   JUICE PLUS FIBRE PO Take by mouth in the morning and at bedtime.   levothyroxine 75 MCG tablet Commonly known as: SYNTHROID Take 1 tablet (75 mcg total) by mouth daily.   loratadine 10 MG tablet Commonly known as:  CLARITIN Take 10 mg by mouth in the morning and at bedtime.   MELATONIN PO Take by mouth daily.   MUCINEX DM PO Take by mouth in the morning and at bedtime.   omeprazole 40 MG capsule Commonly known as: PRILOSEC Take 40 mg by mouth daily.   POTASSIUM PO Take by mouth daily.   triamcinolone cream 0.5 % Commonly known as: KENALOG Apply topically.   VITAMIN C PO Take by mouth daily.   VITAMIN E PO Take by mouth daily.       Review of systems negative except as noted in HPI / PMHx or noted below:  Review of Systems  Constitutional: Negative.   HENT: Negative.   Eyes: Negative.   Respiratory: Negative.   Cardiovascular: Negative.   Gastrointestinal: Negative.   Genitourinary: Negative.   Musculoskeletal: Negative.   Skin: Negative.   Neurological: Negative.   Endo/Heme/Allergies: Negative.   Psychiatric/Behavioral: Negative.     Family History  Problem Relation Age of Onset  . Breast cancer Sister 81  . Cancer Maternal Grandmother   . Cancer Paternal Grandmother   . Heart Problems Paternal Grandfather     Social History   Socioeconomic History  . Marital status: Married    Spouse name: Not on file  . Number of children: Not on file  . Years of education: Not on file  . Highest education level: Not on file  Occupational History  . Not on file  Tobacco Use  . Smoking status: Current Every Day Smoker    Packs/day: 0.75    Years: 16.00    Pack years: 12.00    Types: Cigarettes  . Smokeless tobacco: Never Used  . Tobacco comment: Began smoking in 2005  Substance and Sexual Activity  . Alcohol use: Yes    Comment: occasional drink  . Drug use: Never  . Sexual activity: Not on file  Other Topics Concern  . Not on file  Social History Narrative  . Not on file   Environmental and Social history  Lives in a house with a dry environment, dogs and cats located inside the household, carpet in the bedroom, plastic on the bed, no plastic on the pillow,  and actively smoking tobacco products.  She works as a Conservation officer, nature.  Objective:   Vitals:   12/25/19 0953  BP: 130/86  Pulse: 64  Resp: (!) 8  SpO2: 98%   Height: 5' (152.4 cm) Weight: 140 lb 9.6 oz (63.8 kg)  Physical Exam Constitutional:      Appearance: She is not diaphoretic.     Comments: Raspy voice, throat clearing  HENT:     Head: Normocephalic.     Right Ear: Tympanic membrane, ear canal and external ear normal.     Left Ear: Tympanic membrane, ear canal and external ear normal.  Nose: Mucosal edema present. No rhinorrhea.     Mouth/Throat:     Pharynx: Uvula midline. No oropharyngeal exudate.  Eyes:     Conjunctiva/sclera: Conjunctivae normal.  Neck:     Thyroid: No thyromegaly.     Trachea: Trachea normal. No tracheal tenderness or tracheal deviation.  Cardiovascular:     Rate and Rhythm: Normal rate and regular rhythm.     Heart sounds: Normal heart sounds, S1 normal and S2 normal. No murmur heard.   Pulmonary:     Effort: No respiratory distress.     Breath sounds: Normal breath sounds. No stridor. No wheezing or rales.  Lymphadenopathy:     Head:     Right side of head: No tonsillar adenopathy.     Left side of head: No tonsillar adenopathy.     Cervical: No cervical adenopathy.  Skin:    Findings: No erythema or rash.     Nails: There is no clubbing.  Neurological:     Mental Status: She is alert.     Diagnostics: Allergy skin tests were performed.  She demonstrated hypersensitivity to grass and ragweed.  Assessment and Plan:    1. Perennial allergic rhinitis   2. Seasonal allergic rhinitis due to pollen   3. LPRD (laryngopharyngeal reflux disease)   4. Migraine syndrome   5. Dysfunction of sleep stage or arousal   6. Heavy tobacco smoker >10 cigarettes per day   7. Murmur     1.  Allergen avoidance measures - pollen  2.  Treat and prevent inflammation:   A. Continue Flonase - 1 spray each nostril 2 times per day  B. Start azelastine  - 1 spray each nostril 2 times per day  C. Use nicotine substitutes to replace smoke exposure  3.  Treat and prevent reflux/LPR:   A. Slowly taper caffeine consumption  B. Eliminate fish oil  C. Continue omeprazole 40 mg in AM  D. Start famotidine 40 mg in PM  4.  Treat and prevent headache and sleep dysfunction:   A. Slowly taper caffeine consumption:  B. Periactin 4 mg - 1 tablet at bedtime  5.  If needed:   A. Nasal saline  B. OTC antihistamine (mouth dryness???)  6.  Obtain echocardiogram for murmur  7.  Return to clinic in 4 weeks or earlier if problem  Livier appears to have multiple insults against a respiratory tract including atopic disease, tobacco smoke exposure, and reflux induced respiratory disease for which we will have her utilize the plan of action noted above.  In addition, she has chronic daily headache and sleep dysfunction for which we will start her on Periactin.  To be complete we will obtain an echocardiogram in investigation of her murmur.  I will see her back in this clinic in 4 weeks or earlier if there is a problem.  Candice Priest, MD Allergy / Immunology [facility] [place]

## 2019-12-25 NOTE — Patient Instructions (Addendum)
  1.  Allergen avoidance measures - pollen  2.  Treat and prevent inflammation:   A. Continue Flonase - 1 spray each nostril 2 times per day  B. Start azelastine - 1 spray each nostril 2 times per day  C. Use nicotine substitutes to replace smoke exposure  3.  Treat and prevent reflux/LPR:   A. Slowly taper caffeine consumption  B. Eliminate fish oil  C. Continue omeprazole 40 mg in AM  D. Start famotidine 40 mg in PM  4.  Treat and prevent headache and sleep dysfunction:   A. Slowly taper caffeine consumption:  B. Periactin 4 mg - 1 tablet at bedtime  5.  If needed:   A. Nasal saline  B. OTC antihistamine (mouth dryness???)  6.  Obtain echocardiogram for murmur  7.  Return to clinic in 4 weeks or earlier if problem

## 2019-12-26 ENCOUNTER — Encounter: Payer: Self-pay | Admitting: Allergy and Immunology

## 2020-01-08 DIAGNOSIS — I350 Nonrheumatic aortic (valve) stenosis: Secondary | ICD-10-CM

## 2020-01-15 ENCOUNTER — Telehealth: Payer: Self-pay

## 2020-01-15 NOTE — Telephone Encounter (Signed)
Left message to call the office.  Please inform patient that Dr. Lucie Leather received her ECHO results and it shows mild aortic stenosis (smaller than normal opening of the aortic valve).  Dr. Lucie Leather will discuss things further with her during her return visit.

## 2020-01-20 NOTE — Telephone Encounter (Signed)
Informed of results.  

## 2020-01-22 ENCOUNTER — Ambulatory Visit: Payer: Medicare Other | Admitting: Allergy and Immunology

## 2020-02-07 ENCOUNTER — Encounter: Payer: Self-pay | Admitting: *Deleted

## 2020-02-12 ENCOUNTER — Ambulatory Visit: Payer: Medicare Other | Admitting: Allergy and Immunology

## 2020-03-11 ENCOUNTER — Ambulatory Visit: Payer: Medicare Other | Admitting: Allergy and Immunology

## 2020-04-19 ENCOUNTER — Other Ambulatory Visit: Payer: Self-pay | Admitting: Allergy and Immunology

## 2020-04-19 ENCOUNTER — Other Ambulatory Visit: Payer: Self-pay | Admitting: Internal Medicine

## 2020-05-08 ENCOUNTER — Other Ambulatory Visit: Payer: Self-pay | Admitting: Family Medicine

## 2020-05-08 ENCOUNTER — Ambulatory Visit
Admission: RE | Admit: 2020-05-08 | Discharge: 2020-05-08 | Disposition: A | Discharge status: Home / Self Care | Payer: Medicare Other | Source: Ambulatory Visit | Attending: Family Medicine | Admitting: Family Medicine

## 2020-05-08 DIAGNOSIS — R059 Cough, unspecified: Secondary | ICD-10-CM

## 2020-06-03 ENCOUNTER — Other Ambulatory Visit: Payer: Self-pay | Admitting: Family Medicine

## 2020-06-03 DIAGNOSIS — M859 Disorder of bone density and structure, unspecified: Secondary | ICD-10-CM

## 2020-06-10 ENCOUNTER — Other Ambulatory Visit: Payer: Self-pay | Admitting: *Deleted

## 2020-06-10 MED ORDER — FAMOTIDINE 40 MG PO TABS: ORAL_TABLET | ORAL | 0 refills | Status: DC

## 2020-06-10 MED ORDER — CYPROHEPTADINE HCL 4 MG PO TABS: ORAL_TABLET | ORAL | 0 refills | Status: DC

## 2020-07-18 ENCOUNTER — Other Ambulatory Visit: Payer: Self-pay | Admitting: Allergy and Immunology

## 2020-07-27 ENCOUNTER — Other Ambulatory Visit: Payer: Self-pay | Admitting: Allergy and Immunology

## 2020-08-17 ENCOUNTER — Other Ambulatory Visit: Payer: Self-pay | Admitting: Allergy and Immunology

## 2020-08-31 ENCOUNTER — Ambulatory Visit: Payer: Medicare Other | Admitting: Family

## 2020-09-18 ENCOUNTER — Ambulatory Visit: Payer: Medicare Other | Admitting: Family

## 2020-09-27 ENCOUNTER — Other Ambulatory Visit: Payer: Self-pay | Admitting: Allergy and Immunology

## 2020-10-05 ENCOUNTER — Ambulatory Visit: Payer: Medicare Other | Admitting: Family

## 2020-11-17 ENCOUNTER — Other Ambulatory Visit: Payer: Self-pay | Admitting: Internal Medicine

## 2020-12-24 ENCOUNTER — Other Ambulatory Visit: Payer: Medicare Other

## 2021-02-10 ENCOUNTER — Other Ambulatory Visit: Payer: Self-pay | Admitting: Allergy and Immunology

## 2021-03-26 ENCOUNTER — Other Ambulatory Visit: Payer: Self-pay

## 2021-03-26 ENCOUNTER — Other Ambulatory Visit: Payer: Self-pay | Admitting: Nurse Practitioner

## 2021-03-26 ENCOUNTER — Ambulatory Visit
Admission: RE | Admit: 2021-03-26 | Discharge: 2021-03-26 | Disposition: A | Discharge status: Home / Self Care | Payer: Medicare Other | Source: Ambulatory Visit | Attending: Nurse Practitioner | Admitting: Nurse Practitioner

## 2021-03-26 DIAGNOSIS — R059 Cough, unspecified: Secondary | ICD-10-CM

## 2021-03-31 ENCOUNTER — Other Ambulatory Visit: Payer: Self-pay | Admitting: Nurse Practitioner

## 2021-03-31 DIAGNOSIS — J984 Other disorders of lung: Secondary | ICD-10-CM

## 2021-04-30 ENCOUNTER — Inpatient Hospital Stay: Admission: RE | Admit: 2021-04-30 | Payer: Medicare Other | Source: Ambulatory Visit

## 2021-05-20 ENCOUNTER — Other Ambulatory Visit: Payer: Medicare Other

## 2021-12-13 ENCOUNTER — Other Ambulatory Visit: Payer: Self-pay | Admitting: Family Medicine

## 2021-12-13 DIAGNOSIS — F17210 Nicotine dependence, cigarettes, uncomplicated: Secondary | ICD-10-CM

## 2021-12-13 DIAGNOSIS — Z1231 Encounter for screening mammogram for malignant neoplasm of breast: Secondary | ICD-10-CM

## 2022-01-18 ENCOUNTER — Inpatient Hospital Stay: Admission: RE | Admit: 2022-01-18 | Payer: Medicare Other | Source: Ambulatory Visit

## 2022-02-22 ENCOUNTER — Ambulatory Visit: Payer: Medicare Other

## 2022-04-12 ENCOUNTER — Ambulatory Visit: Payer: Medicare Other

## 2022-05-24 ENCOUNTER — Ambulatory Visit: Payer: Medicare Other

## 2022-06-05 IMAGING — CR DG CHEST 2V
2 series · 2 of 2 positions shown · non-contrast
Comparison: May 08, 2020

CLINICAL DATA: Cough for 2 weeks.

EXAM:
CHEST - 2 VIEW

[w chest pa]
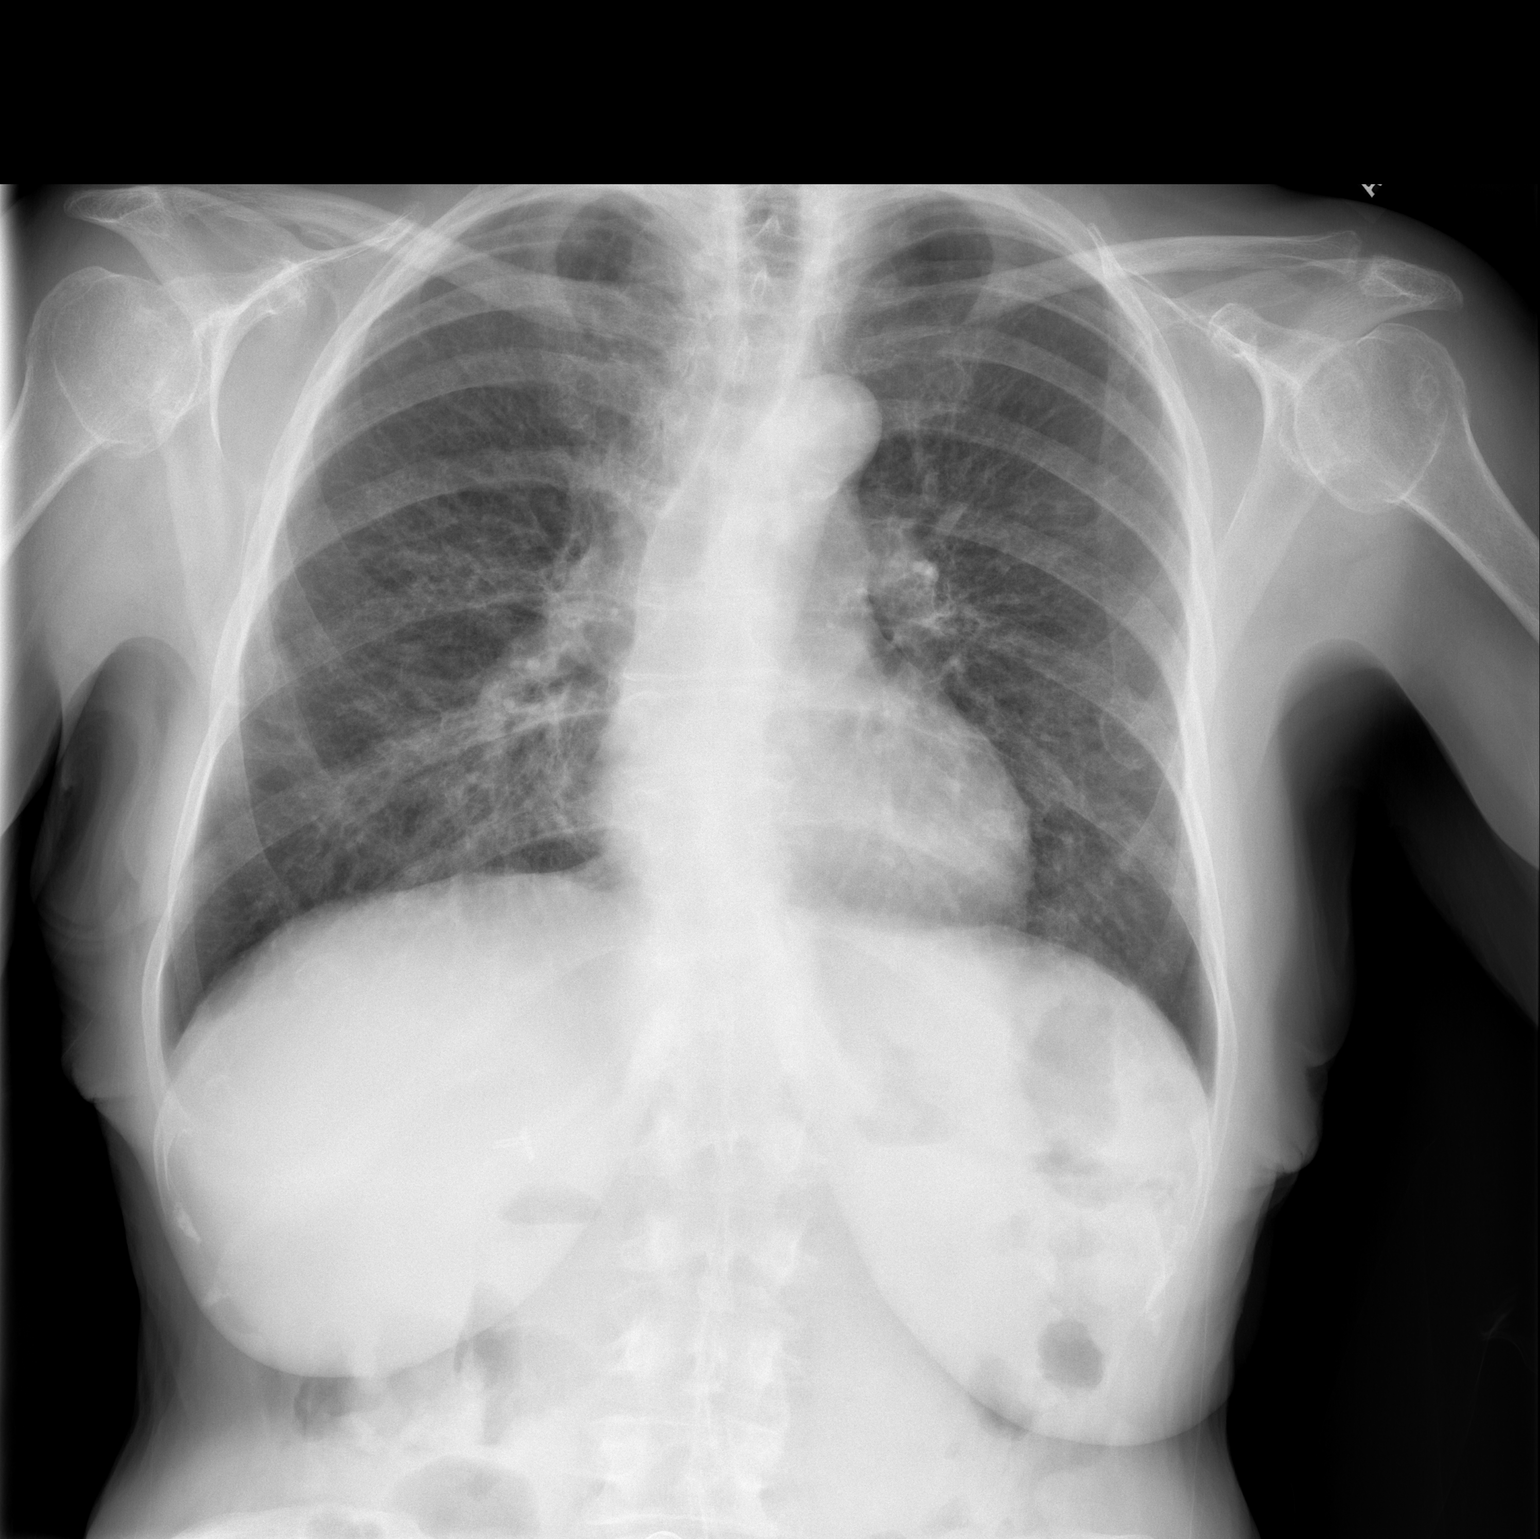

[w chest lat]
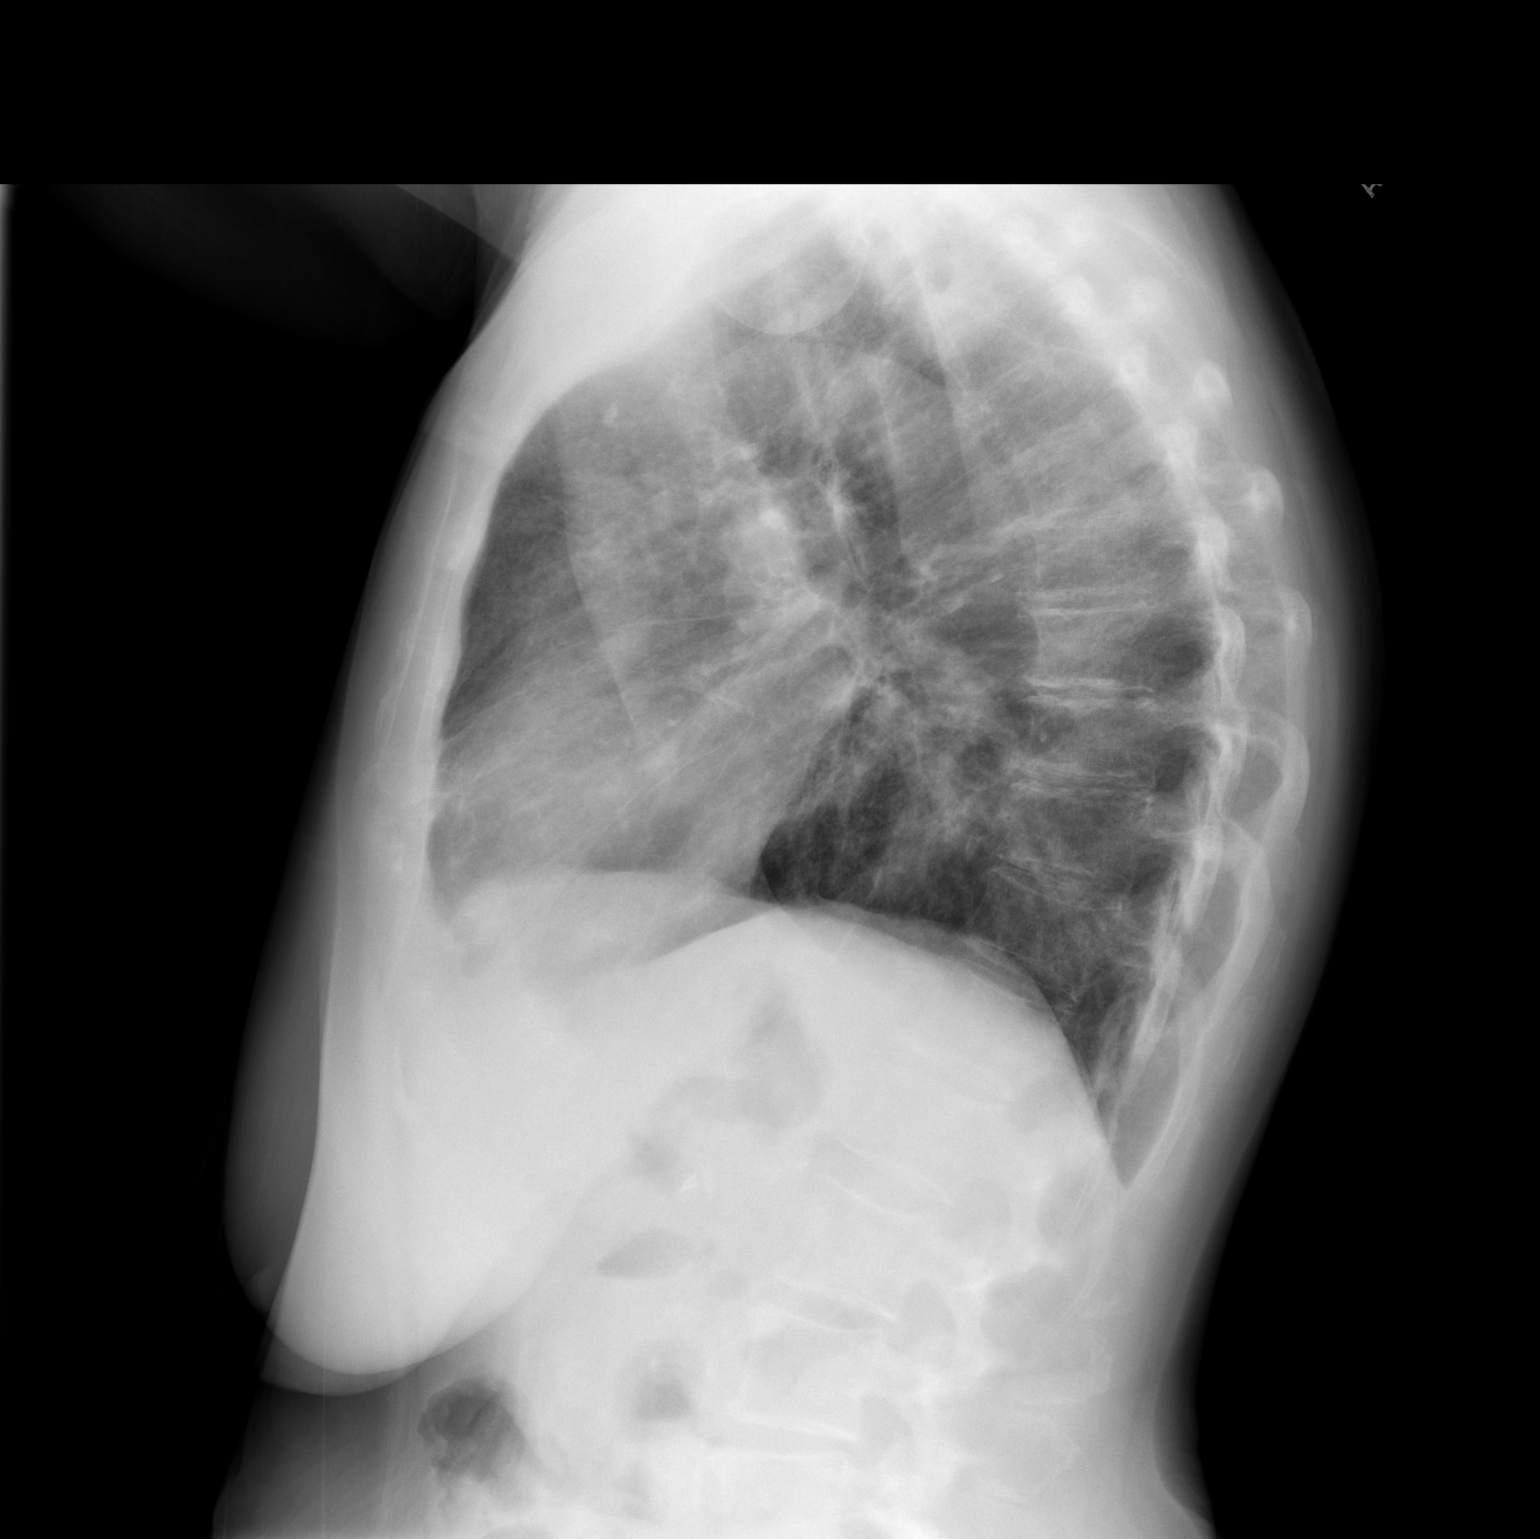

[2 of 2 positions shown; findings below may reference images not displayed]

FINDINGS: The cardiomediastinal silhouette is stable. No pneumothorax. No
nodules or masses. Central hazy opacities bilaterally. There is a
similar appearance but worse in Tuesday April, 2020. The opacity is more
focal in the right base. No other acute abnormalities.
IMPRESSION: Bilateral pulmonary opacities, right greater than left, more focal
in the right base. The findings are similar but less severe compared
to Tuesday April, 2020. Findings may represent recurrent multifocal
pneumonia or a recurrent atypical infection. However, given the
overall similar distribution, chronic etiologies are possible.
Recommend short-term follow-up imaging after treatment to ensure
resolution. If the findings do not resolve, recommend a chest CT.

## 2022-06-20 ENCOUNTER — Other Ambulatory Visit: Payer: Self-pay | Admitting: Family Medicine

## 2022-06-20 DIAGNOSIS — Z1231 Encounter for screening mammogram for malignant neoplasm of breast: Secondary | ICD-10-CM

## 2022-07-05 DIAGNOSIS — Z1231 Encounter for screening mammogram for malignant neoplasm of breast: Secondary | ICD-10-CM

## 2022-07-25 ENCOUNTER — Ambulatory Visit
Admission: RE | Admit: 2022-07-25 | Discharge: 2022-07-25 | Disposition: A | Discharge status: Home / Self Care | Payer: Medicare Other | Source: Ambulatory Visit

## 2022-07-25 DIAGNOSIS — Z1231 Encounter for screening mammogram for malignant neoplasm of breast: Secondary | ICD-10-CM

## 2022-11-07 ENCOUNTER — Other Ambulatory Visit: Payer: Self-pay | Admitting: Family Medicine

## 2022-11-07 DIAGNOSIS — Z13820 Encounter for screening for osteoporosis: Secondary | ICD-10-CM

## 2023-06-27 ENCOUNTER — Other Ambulatory Visit: Payer: Self-pay | Admitting: Family Medicine

## 2023-06-27 DIAGNOSIS — Z1231 Encounter for screening mammogram for malignant neoplasm of breast: Secondary | ICD-10-CM

## 2023-07-31 ENCOUNTER — Other Ambulatory Visit: Payer: Self-pay | Admitting: Family Medicine

## 2023-07-31 DIAGNOSIS — Z1231 Encounter for screening mammogram for malignant neoplasm of breast: Secondary | ICD-10-CM

## 2023-08-01 ENCOUNTER — Ambulatory Visit

## 2023-09-05 ENCOUNTER — Encounter

## 2023-09-05 DIAGNOSIS — Z1231 Encounter for screening mammogram for malignant neoplasm of breast: Secondary | ICD-10-CM

## 2024-01-10 ENCOUNTER — Encounter: Payer: Self-pay | Admitting: Orthopaedic Surgery

## 2024-01-10 ENCOUNTER — Encounter: Payer: Self-pay | Admitting: Family Medicine

## 2024-01-10 ENCOUNTER — Other Ambulatory Visit: Payer: Self-pay | Admitting: Orthopaedic Surgery

## 2024-01-10 DIAGNOSIS — M25511 Pain in right shoulder: Secondary | ICD-10-CM

## 2024-01-12 ENCOUNTER — Ambulatory Visit
Admission: RE | Admit: 2024-01-12 | Discharge: 2024-01-12 | Disposition: A | Discharge status: Home / Self Care | Source: Ambulatory Visit | Attending: Orthopaedic Surgery | Admitting: Orthopaedic Surgery

## 2024-01-12 DIAGNOSIS — M25511 Pain in right shoulder: Secondary | ICD-10-CM

## 2024-03-06 ENCOUNTER — Other Ambulatory Visit: Payer: Self-pay

## 2024-03-06 ENCOUNTER — Encounter (HOSPITAL_BASED_OUTPATIENT_CLINIC_OR_DEPARTMENT_OTHER): Payer: Self-pay | Admitting: Orthopaedic Surgery

## 2024-03-07 ENCOUNTER — Encounter (HOSPITAL_BASED_OUTPATIENT_CLINIC_OR_DEPARTMENT_OTHER)
Admission: RE | Admit: 2024-03-07 | Discharge: 2024-03-07 | Disposition: A | Discharge status: Home / Self Care | Source: Ambulatory Visit | Attending: Orthopaedic Surgery | Admitting: Orthopaedic Surgery

## 2024-03-07 DIAGNOSIS — Z01818 Encounter for other preprocedural examination: Secondary | ICD-10-CM | POA: Diagnosis present

## 2024-03-07 LAB — SURGICAL PCR SCREEN
MRSA, PCR: NEGATIVE
Staphylococcus aureus: NEGATIVE

## 2024-03-07 NOTE — Progress Notes (Signed)
 Surgical soap given with instructions, pt verbalized understanding. Benzoyl peroxide gel given with instructions, pt verbalized understanding.

## 2024-03-12 NOTE — H&P (Signed)
 "   PREOPERATIVE H&P  Chief Complaint: osteoarthritis of right shoulder  HPI: Candice Oliver is a 74 y.o. female who is scheduled for Procedures: ARTHROPLASTY, SHOULDER, TOTAL, REVERSE.   A 74 year old female presents for evaluation of right shoulder pain. The patient has a history of receiving injections from Dr. Shari remotely. 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 The patient reports significant pain and limited function, particularly noting difficulty with movements behind her back. She has been informed that her condition is akin to bone-on-bone arthritis, causing the ball and socket of the shoulder to be misaligned, leading to pain and dysfunction. She has no significant comorbidities affecting her overall health, although she manages thyroid  and blood pressure issues, which are stable. The patient works as an government social research officer at [employer], which involves some physical activity, but she believes she can manage her duties with minimal use of her right arm.   Symptoms are rated as moderate to severe, and have been worsening.  This is significantly impairing activities of daily living.    Please see clinic note for further details on this patient's care.    She has elected for surgical management.   Past Medical History:  Diagnosis Date   Acid reflux    Fibromyalgia    High blood pressure    Hypothyroidism    Scoliosis    Past Surgical History:  Procedure Laterality Date   ABDOMINAL HYSTERECTOMY     ADENOIDECTOMY     BREAST LUMPECTOMY Right    FOOT SURGERY Left    5th metatarsal- screw   TONSILLECTOMY     Social History   Socioeconomic History   Marital status: Married    Spouse name: Not on file   Number of children: Not on file   Years of education: Not on file   Highest education level: Not on file  Occupational History   Not on file  Tobacco Use   Smoking  status: Former    Current packs/day: 0.75    Average packs/day: 0.8 packs/day for 16.0 years (12.0 ttl pk-yrs)    Types: Cigarettes   Smokeless tobacco: Never   Tobacco comments:    Began smoking in 2005  Vaping Use   Vaping status: Every Day  Substance and Sexual Activity   Alcohol use: Yes    Comment: occasional drink   Drug use: Never   Sexual activity: Not on file  Other Topics Concern   Not on file  Social History Narrative   Not on file   Social Drivers of Health   Tobacco Use: Medium Risk (03/06/2024)   Patient History    Smoking Tobacco Use: Former    Smokeless Tobacco Use: Never    Passive Exposure: Not on Actuary Strain: Not on file  Food Insecurity: Not on file  Transportation Needs: Not on file  Physical Activity: Not on file  Stress: Not on file  Social Connections: Not on file  Depression (EYV7-0): Not on file  Alcohol Screen: Not on file  Housing: Not on file  Utilities: Not on file  Health Literacy: Not on file   Family History  Problem Relation Age of Onset   Breast cancer Sister 55   Cancer Maternal Grandmother    Cancer Paternal Grandmother    Heart Problems Paternal Grandfather    Allergies[1] Prior to Admission medications  Medication Sig Start Date End Date Taking? Authorizing  Provider  Ascorbic Acid (VITAMIN C PO) Take by mouth daily.   Yes [provider]  atenolol (TENORMIN) 50 MG tablet atenolol 50 mg tablet 04/19/16  Yes [provider]  azelastine  (ASTELIN ) 0.1 % nasal spray Use one spray in each nostril twice daily. 12/25/19  Yes Kozlow, Eric J, MD  Dextromethorphan-guaiFENesin [facility] DM PO) Take by mouth in the morning and at bedtime.   Yes [provider]  famotidine  (PEPCID ) 40 MG tablet TAKE 1 TABLET BY MOUTH EVERYDAY AT BEDTIME 07/28/20  Yes Kozlow, Eric J, MD  fluticasone (FLONASE) 50 MCG/ACT nasal spray Place 2 sprays into both nostrils daily.   Yes [provider]   HYDROcodone-acetaminophen  (NORCO) 10-325 MG tablet Take by mouth. 12/21/19  Yes [provider]  levothyroxine  (SYNTHROID ) 75 MCG tablet TAKE 1 TABLET BY MOUTH EVERY DAY 04/20/20  Yes Trixie File, MD  loratadine (CLARITIN) 10 MG tablet Take 10 mg by mouth in the morning and at bedtime.   Yes [provider]  MELATONIN PO Take by mouth daily.   Yes [provider]  omeprazole (PRILOSEC) 40 MG capsule Take 40 mg by mouth daily.   Yes [provider]  Calcium Carb-Cholecalciferol (CALCIUM/VITAMIN D PO) Take by mouth daily.    [provider]  cyproheptadine  (PERIACTIN ) 4 MG tablet TAKE 1 TABLET BY MOUTH EVERYDAY AT BEDTIME 07/28/20   Kozlow, Camellia PARAS, MD  Nutritional Supplements (JUICE PLUS FIBRE PO) Take by mouth in the morning and at bedtime.    [provider]  Omega-3 Fatty Acids (FISH OIL PO) Take by mouth daily.    [provider]  POTASSIUM PO Take by mouth daily.    [provider]  triamcinolone cream (KENALOG) 0.5 % Apply topically. 11/27/19   [provider]  VITAMIN E PO Take by mouth daily.    [provider]    ROS: All other systems have been reviewed and were otherwise negative with the exception of those mentioned in the HPI and as above.  Physical Exam: General: Alert, no acute distress Cardiovascular: No pedal edema Respiratory: No cyanosis, no use of accessory musculature GI: No organomegaly, abdomen is soft and non-tender Skin: No lesions in the area of chief complaint Neurologic: Sensation intact distally Psychiatric: Patient is competent for consent with normal mood and affect Lymphatic: No axillary or cervical lymphadenopathy  MUSCULOSKELETAL:  On examination, the patient demonstrates active forward elevation of the right shoulder to 120 degrees, with passive elevation reaching 130 degrees. There is weakness noted with supraspinatus testing, indicating compromised rotator cuff  function.   Imaging: Three views of the right shoulder reveal end-stage arthritis with erosion of the posterior glenoid and a B3 variant. Independent interpretation performed by me confirms these findings.  BMI: Estimated body mass index is 25.58 kg/m as calculated from the following:   Height as of this encounter: 5' (1.524 m).   Weight as of this encounter: 59.4 kg.  No results found for: ALBUMIN Diabetes: Patient does not have a diagnosis of diabetes.     Smoking Status:       Assessment: osteoarthritis of right shoulder  Plan: Plan for Procedures: ARTHROPLASTY, SHOULDER, TOTAL, REVERSE  The risks benefits and alternatives were discussed with the patient including but not limited to the risks of nonoperative treatment, versus surgical intervention including infection, bleeding, nerve injury,  blood clots, cardiopulmonary complications, morbidity, mortality, among others, and they were willing to proceed.   We additionally specifically discussed risks of axillary  nerve injury, infection, periprosthetic fracture, continued pain and longevity of implants prior to beginning procedure.    Patient will be closely monitored in PACU for medical stabilization and pain control. If found stable in PACU, patient may be discharged home with outpatient follow-up. If any concerns regarding patient's stabilization patient will be admitted for observation after surgery. The patient is planning to be discharged home with outpatient PT.   The patient acknowledged the explanation, agreed to proceed with the plan and consent was signed.   Operative Plan: R RTSA  Discharge Medications: standard DVT Prophylaxis: aspirin  Physical Therapy: outpatient Special Discharge needs: Sling (should bring with her). IceMan   Aleck LOISE Stalling, PA-C  03/12/2024 8:34 AM     [1]  Allergies Allergen Reactions   Penicillins Rash   "

## 2024-03-13 NOTE — Discharge Instructions (Signed)
 Bonner Hair MD, MPH Aleck Stalling, PA-C [facility] [address] [phone] (tel)   [phone] (fax)   POST-OPERATIVE INSTRUCTIONS - TOTAL SHOULDER REPLACEMENT    WOUND CARE You may leave the operative dressing in place until your follow-up appointment. KEEP THE INCISIONS CLEAN AND DRY. There may be a small amount of fluid/bleeding leaking at the surgical site. This is normal after surgery.  If it fills with liquid or blood please call us  immediately to change it for you. Use the provided ice machine or Ice packs as often as possible for the first 3-4 days, then as needed for pain relief.   Keep a layer of cloth or a shirt between your skin and the cooling unit to prevent frost bite as it can get very cold.  SHOWERING: - You may shower on Post-Op Day #2.  - The dressing is water resistant but do not scrub it as it may start to peel up.   - You may remove the sling for showering - Gently pat the area dry.  - Do not soak the shoulder in water.  - Do not go swimming in the pool or ocean until your incision has completely healed (about 4-6 weeks after surgery) - KEEP THE INCISIONS CLEAN AND DRY.  EXERCISES Wear the sling at all times  You may remove the sling for showering, but keep the arm across the chest or in a secondary sling.    Accidental/Purposeful External Rotation and shoulder flexion (reaching behind you) is to be avoided at all costs for the first month. It is ok to come out of your sling if your are sitting and have assistance for eating.   Do not lift anything heavier than 1 pound until we discuss it further in clinic.  It is normal for your fingers/hand to become more swollen after surgery and discolored from bruising.   This will resolve over the first few weeks usually after surgery. Please continue to ambulate and do not stay sitting or lying for too long.  Perform foot and wrist pumps to assist in circulation.  PHYSICAL  THERAPY - You will begin physical therapy soon after surgery (unless otherwise specified) - Please call to set up an appointment, if you do not already have one  - Let our office if there are any issues with scheduling your therapy    REGIONAL ANESTHESIA (NERVE BLOCKS) The anesthesia team may have performed a nerve block for you this is a great tool used to minimize pain.   The block may start wearing off overnight (between 8-24 hours postop) When the block wears off, your pain may go from nearly zero to the pain you would have had postop without the block. This is an abrupt transition but nothing dangerous is happening.   This can be a challenging period but utilize your as needed pain medications to try and manage this period. We suggest you use the pain medication the first night prior to going to bed, to ease this transition.  You may take an extra dose of narcotic when this happens if needed   POST-OP MEDICATIONS- Multimodal approach to pain control In general your pain will be controlled with a combination of substances.  Prescriptions unless otherwise discussed are electronically sent to your pharmacy.  This is a carefully made plan we use to minimize narcotic use.     Celebrex - Anti-inflammatory medication taken on a scheduled basis Acetaminophen  - Non-narcotic pain medicine taken on a scheduled  basis  Oxycodone  - This is a strong narcotic, to be used only on an "as needed" basis for SEVERE pain. Aspirin  81mg  - This medicine is used to minimize the risk of blood clots after surgery. Zofran  -  take as needed for nausea   FOLLOW-UP If you develop a Fever (>101.5), Redness or Drainage from the surgical incision site, please call our office to arrange for an evaluation. Please call the office to schedule a follow-up appointment for a wound check, 7-10 days post-operatively.  IF YOU HAVE ANY QUESTIONS, PLEASE FEEL FREE TO CALL OUR OFFICE.  HELPFUL INFORMATION  Your arm will be  in a sling following surgery. You will be in this sling for the next 4 weeks.   You may be more comfortable sleeping in a semi-seated position the first few nights following surgery.  Keep a pillow propped under the elbow and forearm for comfort.  If you have a recliner type of chair it might be beneficial.  If not that is fine too, but it would be helpful to sleep propped up with pillows behind your operated shoulder as well under your elbow and forearm.  This will reduce pulling on the suture lines.  When dressing, put your operative arm in the sleeve first.  When getting undressed, take your operative arm out last.  Loose fitting, button-down shirts are recommended.  In most states it is against the law to drive while your arm is in a sling. And certainly against the law to drive while taking narcotics.  You may return to work/school in the next couple of days when you feel up to it. Desk work and typing in the sling is fine.  We suggest you use the pain medication the first night prior to going to bed, in order to ease any pain when the anesthesia wears off. You should avoid taking pain medications on an empty stomach as it will make you nauseous.  You should wean off your narcotic medicines as soon as you are able.     Most patients will be off narcotics before their first postop appointment.   Do not drink alcoholic beverages or take illicit drugs when taking pain medications.  Pain medication may make you constipated.  Below are a few solutions to try in this order: Decrease the amount of pain medication if you aren't having pain. Drink lots of decaffeinated fluids. Drink prune juice and/or each dried prunes  If the first 3 don't work start with additional solutions Take Colace - an over-the-counter stool softener Take Senokot - an over-the-counter laxative Take Miralax  - a stronger over-the-counter laxative   Dental Antibiotics:  We require dental prophylaxis for 2 years after a  shoulder replacement  Contact your surgeon for an antibiotic prescription, prior to your dental procedure.   For more information including helpful videos and documents visit our website:   [email or web address]

## 2024-03-14 ENCOUNTER — Ambulatory Visit (HOSPITAL_BASED_OUTPATIENT_CLINIC_OR_DEPARTMENT_OTHER): Admitting: Anesthesiology

## 2024-03-14 ENCOUNTER — Other Ambulatory Visit: Payer: Self-pay

## 2024-03-14 ENCOUNTER — Ambulatory Visit (HOSPITAL_COMMUNITY)

## 2024-03-14 ENCOUNTER — Encounter (HOSPITAL_BASED_OUTPATIENT_CLINIC_OR_DEPARTMENT_OTHER)
Admission: RE | Disposition: A | Discharge status: Home / Self Care | Payer: Self-pay | Source: Home / Self Care | Attending: Orthopaedic Surgery

## 2024-03-14 ENCOUNTER — Ambulatory Visit (HOSPITAL_BASED_OUTPATIENT_CLINIC_OR_DEPARTMENT_OTHER)
Admission: RE | Admit: 2024-03-14 | Discharge: 2024-03-14 | Disposition: A | Discharge status: Home / Self Care | Attending: Orthopaedic Surgery | Admitting: Orthopaedic Surgery

## 2024-03-14 ENCOUNTER — Encounter (HOSPITAL_BASED_OUTPATIENT_CLINIC_OR_DEPARTMENT_OTHER): Payer: Self-pay | Admitting: Orthopaedic Surgery

## 2024-03-14 DIAGNOSIS — I10 Essential (primary) hypertension: Secondary | ICD-10-CM | POA: Diagnosis not present

## 2024-03-14 DIAGNOSIS — M797 Fibromyalgia: Secondary | ICD-10-CM | POA: Insufficient documentation

## 2024-03-14 DIAGNOSIS — M19011 Primary osteoarthritis, right shoulder: Secondary | ICD-10-CM

## 2024-03-14 DIAGNOSIS — E039 Hypothyroidism, unspecified: Secondary | ICD-10-CM | POA: Diagnosis not present

## 2024-03-14 DIAGNOSIS — K219 Gastro-esophageal reflux disease without esophagitis: Secondary | ICD-10-CM | POA: Diagnosis not present

## 2024-03-14 DIAGNOSIS — Z87891 Personal history of nicotine dependence: Secondary | ICD-10-CM | POA: Diagnosis not present

## 2024-03-14 DIAGNOSIS — Z01818 Encounter for other preprocedural examination: Secondary | ICD-10-CM

## 2024-03-14 MED ORDER — TRAMADOL HCL 50 MG PO TABS: 50.0000 mg | ORAL_TABLET | Freq: Four times a day (QID) | ORAL | 0 refills | Status: AC | PRN

## 2024-03-14 MED ORDER — FENTANYL CITRATE (PF) 100 MCG/2ML IJ SOLN: 25.0000 ug | INTRAMUSCULAR | Status: DC | PRN

## 2024-03-14 MED ORDER — VANCOMYCIN HCL 1000 MG IV SOLR
INTRAVENOUS | Status: DC | PRN
  Administered 2024-03-14: 1000 mg via TOPICAL

## 2024-03-14 MED ORDER — ACETAMINOPHEN 500 MG PO TABS: 500.0000 mg | ORAL_TABLET | Freq: Three times a day (TID) | ORAL | 0 refills | Status: AC

## 2024-03-14 MED ORDER — 0.9 % SODIUM CHLORIDE (POUR BTL) OPTIME
TOPICAL | Status: DC | PRN
  Administered 2024-03-14: 2000 mL

## 2024-03-14 MED ORDER — FENTANYL CITRATE (PF) 100 MCG/2ML IJ SOLN
INTRAMUSCULAR | Status: AC
  Filled 2024-03-14: qty 2

## 2024-03-14 MED ORDER — ASPIRIN 81 MG PO CHEW: 81.0000 mg | CHEWABLE_TABLET | Freq: Two times a day (BID) | ORAL | 0 refills | Status: AC

## 2024-03-14 MED ORDER — MIDAZOLAM HCL 2 MG/2ML IJ SOLN
INTRAMUSCULAR | Status: AC
  Filled 2024-03-14: qty 2

## 2024-03-14 MED ORDER — PROPOFOL 10 MG/ML IV BOLUS
INTRAVENOUS | Status: DC | PRN
  Administered 2024-03-14: 100 mg via INTRAVENOUS

## 2024-03-14 MED ORDER — TRANEXAMIC ACID-NACL 1000-0.7 MG/100ML-% IV SOLN
INTRAVENOUS | Status: AC
  Filled 2024-03-14: qty 100

## 2024-03-14 MED ORDER — CEFAZOLIN SODIUM-DEXTROSE 2-4 GM/100ML-% IV SOLN
2.0000 g | INTRAVENOUS | Status: AC
  Administered 2024-03-14: 2 g via INTRAVENOUS

## 2024-03-14 MED ORDER — PROMETHAZINE HCL 12.5 MG PO TABS: 12.5000 mg | ORAL_TABLET | Freq: Four times a day (QID) | ORAL | 0 refills | Status: AC | PRN

## 2024-03-14 MED ORDER — BUPIVACAINE-EPINEPHRINE (PF) 0.5% -1:200000 IJ SOLN
INTRAMUSCULAR | Status: DC | PRN
  Administered 2024-03-14: 15 mL via PERINEURAL

## 2024-03-14 MED ORDER — LACTATED RINGERS IV SOLN: INTRAVENOUS | Status: DC

## 2024-03-14 MED ORDER — OXYCODONE HCL 5 MG PO TABS: 5.0000 mg | ORAL_TABLET | Freq: Once | ORAL | Status: DC | PRN

## 2024-03-14 MED ORDER — VANCOMYCIN HCL 1000 MG IV SOLR
INTRAVENOUS | Status: AC
  Filled 2024-03-14: qty 40

## 2024-03-14 MED ORDER — AMISULPRIDE (ANTIEMETIC) 5 MG/2ML IV SOLN: 10.0000 mg | Freq: Once | INTRAVENOUS | Status: DC | PRN

## 2024-03-14 MED ORDER — GABAPENTIN 300 MG PO CAPS
ORAL_CAPSULE | ORAL | Status: AC
  Filled 2024-03-14: qty 1

## 2024-03-14 MED ORDER — OXYCODONE HCL 5 MG/5ML PO SOLN: 5.0000 mg | Freq: Once | ORAL | Status: DC | PRN

## 2024-03-14 MED ORDER — EPHEDRINE SULFATE (PRESSORS) 25 MG/5ML IV SOSY
PREFILLED_SYRINGE | INTRAVENOUS | Status: DC | PRN
  Administered 2024-03-14: 15 mg via INTRAVENOUS

## 2024-03-14 MED ORDER — ROCURONIUM BROMIDE 100 MG/10ML IV SOLN
INTRAVENOUS | Status: DC | PRN
  Administered 2024-03-14: 40 mg via INTRAVENOUS

## 2024-03-14 MED ORDER — PHENYLEPHRINE HCL-NACL 20-0.9 MG/250ML-% IV SOLN
INTRAVENOUS | Status: DC | PRN
  Administered 2024-03-14: 20 ug/min via INTRAVENOUS

## 2024-03-14 MED ORDER — TRANEXAMIC ACID-NACL 1000-0.7 MG/100ML-% IV SOLN
1000.0000 mg | INTRAVENOUS | Status: AC
  Administered 2024-03-14: 1000 mg via INTRAVENOUS

## 2024-03-14 MED ORDER — FENTANYL CITRATE (PF) 100 MCG/2ML IJ SOLN
INTRAMUSCULAR | Status: DC | PRN
  Administered 2024-03-14: 50 ug via INTRAVENOUS

## 2024-03-14 MED ORDER — BUPIVACAINE LIPOSOME 1.3 % IJ SUSP
INTRAMUSCULAR | Status: DC | PRN
  Administered 2024-03-14: 10 mL via PERINEURAL

## 2024-03-14 MED ORDER — ACETAMINOPHEN 500 MG PO TABS
1000.0000 mg | ORAL_TABLET | Freq: Once | ORAL | Status: AC
  Administered 2024-03-14: 1000 mg via ORAL

## 2024-03-14 MED ORDER — CEFAZOLIN SODIUM-DEXTROSE 2-4 GM/100ML-% IV SOLN
INTRAVENOUS | Status: AC
  Filled 2024-03-14: qty 100

## 2024-03-14 MED ORDER — PROPOFOL 10 MG/ML IV BOLUS
INTRAVENOUS | Status: AC
  Filled 2024-03-14: qty 20

## 2024-03-14 MED ORDER — ONDANSETRON HCL 4 MG/2ML IJ SOLN
INTRAMUSCULAR | Status: DC | PRN
  Administered 2024-03-14: 4 mg via INTRAVENOUS

## 2024-03-14 MED ORDER — LIDOCAINE HCL (CARDIAC) PF 100 MG/5ML IV SOSY
PREFILLED_SYRINGE | INTRAVENOUS | Status: DC | PRN
  Administered 2024-03-14: 40 mg via INTRAVENOUS

## 2024-03-14 MED ORDER — CELECOXIB 100 MG PO CAPS: 100.0000 mg | ORAL_CAPSULE | Freq: Two times a day (BID) | ORAL | 0 refills | Status: AC

## 2024-03-14 MED ORDER — FENTANYL CITRATE (PF) 100 MCG/2ML IJ SOLN
100.0000 ug | Freq: Once | INTRAMUSCULAR | Status: AC
  Administered 2024-03-14: 50 ug via INTRAVENOUS

## 2024-03-14 MED ORDER — LIDOCAINE 2% (20 MG/ML) 5 ML SYRINGE
INTRAMUSCULAR | Status: AC
  Filled 2024-03-14: qty 5

## 2024-03-14 MED ORDER — ACETAMINOPHEN 500 MG PO TABS
ORAL_TABLET | ORAL | Status: AC
  Filled 2024-03-14: qty 2

## 2024-03-14 MED ORDER — DEXAMETHASONE SODIUM PHOSPHATE 4 MG/ML IJ SOLN
INTRAMUSCULAR | Status: DC | PRN
  Administered 2024-03-14: 5 mg via INTRAVENOUS

## 2024-03-14 MED ORDER — SUGAMMADEX SODIUM 200 MG/2ML IV SOLN
INTRAVENOUS | Status: DC | PRN
  Administered 2024-03-14: 200 mg via INTRAVENOUS

## 2024-03-14 MED ORDER — GABAPENTIN 300 MG PO CAPS
300.0000 mg | ORAL_CAPSULE | Freq: Once | ORAL | Status: AC
  Administered 2024-03-14: 300 mg via ORAL

## 2024-03-14 MED ORDER — ROCURONIUM BROMIDE 10 MG/ML (PF) SYRINGE
PREFILLED_SYRINGE | INTRAVENOUS | Status: AC
  Filled 2024-03-14: qty 10

## 2024-03-14 NOTE — Anesthesia Procedure Notes (Signed)
 Procedure Name: Intubation Date/Time: 03/14/2024 8:10 AM  Performed by: Burnard Rosaline HERO, CRNAPre-anesthesia Checklist: Patient identified, Emergency Drugs available, Suction available and Patient being monitored Patient Re-evaluated:Patient Re-evaluated prior to induction Oxygen Delivery Method: Circle system utilized Preoxygenation: Pre-oxygenation with 100% oxygen Induction Type: IV induction Ventilation: Mask ventilation without difficulty Laryngoscope Size: Mac and 4 Grade View: Grade I Tube type: Oral Tube size: 7.0 mm Number of attempts: 1 Airway Equipment and Method: Stylet and Oral airway Placement Confirmation: ETT inserted through vocal cords under direct vision, positive ETCO2, breath sounds checked- equal and bilateral and CO2 detector Secured at: 22 cm Tube secured with: Tape Dental Injury: Teeth and Oropharynx as per pre-operative assessment

## 2024-03-14 NOTE — Interval H&P Note (Signed)
 All questions answered, patient wants to proceed with procedure. ? ?

## 2024-03-14 NOTE — Op Note (Signed)
 Orthopaedic Surgery Operative Note (CSN: [id number])  [name]  1950-05-17 Date of Surgery: 03/14/2024   Diagnoses:  osteoarthritis of right shoulder  Procedure: Right reverse augmented Total Shoulder Arthroplasty   Operative Finding Successful completion of planned procedure. B3 glenoid noted, poor bone stock.  Tug test normal  Post-operative plan: The patient will be NWB in sling.  The patient will be will be discharged from PACU if continues to be stable as was plan prior to surgery.  DVT prophylaxis Aspirin  81 mg twice daily for 6 weeks.  Pain control with PRN pain medication preferring oral medicines.  Follow up plan will be scheduled in approximately 7 days for incision check and XR.  Physical therapy to start immediately.  Implants:Perform humeral 2 long stem, +6 retentive polyethylene, 36 glenosphere with a 25 half wedge baseplate, 35 center screw and 4 peripheral screws  Nylon sutures used  Post-Op Diagnosis: Same Surgeons:Primary: Cristy Bonner DASEN, MD Assistants:Caroline McBane, PA-C Location: [facility] OR ROOM 6 Anesthesia: General with Exparel  Interscalene Antibiotics: Ancef  2g preop, Vancomycin  1000mg  locally Tourniquet time: None Estimated Blood Loss: 100 Complications: None Specimens: None Implants: Implant Name Type Inv. Item Serial No. Manufacturer Lot No. LRB No. Used Action  AUGMENT BASEPLATE 50M 35D WEDG - [id number] Joint AUGMENT BASEPLATE 50M 35D WEDG [id number] [employer]  Right 1 Implanted  HEAD CANN REV SHLD STD 36 - [id number] Shoulder HEAD CANN REV SHLD STD 36 [id number] [employer]  Right 1 Implanted  SCREW BONE THREAD 6.5X35 - [id number] Screw SCREW BONE THREAD 6.5X35  [employer]  Right 1 Implanted  SCREW 5.5X14 - [id number] Screw SCREW 5.5X14  [employer]  Right 1 Implanted  SCREW 5.0X18 - [id number] Screw SCREW 5.0X18  [employer]  Right 1 Implanted  SCREW 5.5X26 - [id number] Screw SCREW 5.5X26  [employer]  Right 2 Implanted  STEM HUMERAL  PLUS LONG SZ2 - [id number] Orthopedic Implant STEM HUMERAL PLUS LONG SZ2 [id number] [employer]  Right 1 Implanted  tornier humeral Orthopedic Implant  [id number] [employer]  Right 1 Implanted    Indications for Surgery:   Candice Oliver is a 74 y.o. female with end-stage glenohumeral arthritis.  Benefits and risks of operative and nonoperative management were discussed prior to surgery with patient/guardian(s) and informed consent form was completed.  Infection and need for further surgery were discussed as was prosthetic stability and cuff issues.  We additionally specifically discussed risks of axillary nerve injury, infection, periprosthetic fracture, continued pain and longevity of implants prior to beginning procedure.      Procedure:   The patient was identified in the preoperative holding area where the surgical site was marked. Block placed by anesthesia with exparel .  The patient was taken to the OR where a procedural timeout was called and the above noted anesthesia was induced.  The patient was positioned beachchair on allen table with spider arm positioner.  Preoperative antibiotics were dosed.  The patient's right shoulder was prepped and draped in the usual sterile fashion.  A second preoperative timeout was called.       Standard deltopectoral approach was performed with a #10 blade. We dissected down to the subcutaneous tissues and the cephalic vein was taken laterally with the deltoid. Clavipectoral fascia was incised in line with the incision. Deep retractors were placed. The long of the biceps tendon was identified and there was significant tenosynovitis present.  Tenodesis was performed to the pectoralis tendon with #2 Ethibond. The remaining biceps was followed up into the  rotator interval where it was released.   The subscapularis was taken down in a full thickness layer with capsule along the humeral neck extending inferiorly around the humeral head. We continued releasing  the capsule directly off of the osteophytes inferiorly all the way around the corner. This allowed us  to dislocate the humeral head.   There were osteophytes along the inferior humeral neck. The osteophytes were removed with an osteotome and a rongeur.  Osteophytes were removed with a rongeur and an osteotome and the anatomic neck was well visualized.     A humeral cutting guide was used extra medullary with a pin to help control version. The version was set at 20 of retroversion. Humeral osteotomy was performed with an oscillating saw. The head fragment was passed off the back table.  A cut protector plate was placed.  The subscapularis was again identified and immediately we took care to palpate the axillary nerve anteriorly and verify its position with gentle palpation as well as the tug test.  We then released the SGHL with bovie cautery prior to placing a curved mayo at the junction of the anterior glenoid well above the axillary nerve and bluntly dissecting the subscapularis from the capsule.  We then carefully protected the axillary nerve as we gently released the inferior capsule to fully mobilize the subscapularis.  An anterior deltoid retractor was then placed as well as a small Hohmann retractor superiorly  The remaining labrum was removed circumferentially taking great care not to disrupt the posterior capsule.   At this point we felt based on blueprint templating that a half wedge augment was necessary.  We began by using a wedge guide to place our center pin as was templated.  We had good position of this pin and we proceeded with our starter center drill.  This allowed for us  to use the 35 degree full wedge reamer obtaining circumferential witness marks and good bone preparation for ingrowth.  At this point we proceeded with our center drill and had an intact vault.  We then drilled our center screw.  We placed our baseplate full wedge.  We double checked that we had good apposition of  the base plate to bone and then proceeded to place 3 locking screws and one nonlocking screw as is typical.   Next a glenosphere was selected and impacted onto the baseplate. The center screw was tightened.  We turned attention back to the humeral side. The cut protector was removed.  We used the broaches and prep system for the humerus to prepare and placed our trial broach.  The shoulder was trialed.  There was good ROM in all planes and the shoulder was stable with no inferior translation.  The real humeral implants were opened after again confirming sizes.  The trial was removed. #5 Fiberwire x2 sutures passed through the humeral neck for subscap repair. The humeral component was press-fit obtaining a secure fit. The joint was reduced and thoroughly irrigated with pulsatile lavage. Subscap was repaired back with #5 Fiberwire sutures through bone tunnels. Hemostasis was obtained. The deltopectoral interval was reapproximated with #1 Ethibond. The subcutaneous tissues were closed with 2-0 Vicryl and the skin was closed with running nylon.    The wounds were cleaned and dried and an Aquacel dressing was placed. The drapes taken down. The arm was placed into sling with abduction pillow. Patient was awakened, extubated, and transferred to the recovery room in stable condition. There were no intraoperative complications. The sponge, needle, and  attention counts were correct at the end of the case.     Aleck Stalling, PA-C, present and scrubbed throughout the case, critical for completion in a timely fashion, and for retraction, instrumentation, closure.

## 2024-03-14 NOTE — Anesthesia Preprocedure Evaluation (Signed)
"                                    Anesthesia Evaluation  Patient identified by MRN, date of birth, ID band Patient awake    Reviewed: Allergy & Precautions, NPO status , Patient's Chart, lab work & pertinent test results  Airway Mallampati: II  TM Distance: >3 FB Neck ROM: Full    Dental  (+) Dental Advisory Given   Pulmonary former smoker   breath sounds clear to auscultation       Cardiovascular hypertension, Pt. on medications and Pt. on home beta blockers  Rhythm:Regular Rate:Normal     Neuro/Psych  Neuromuscular disease    GI/Hepatic Neg liver ROS,GERD  ,,  Endo/Other  Hypothyroidism    Renal/GU negative Renal ROS     Musculoskeletal  (+)  Fibromyalgia -  Abdominal   Peds  Hematology negative hematology ROS (+)   Anesthesia Other Findings   Reproductive/Obstetrics                              Anesthesia Physical Anesthesia Plan  ASA: 2  Anesthesia Plan: General   Post-op Pain Management: Regional block* and Tylenol  PO (pre-op)*   Induction: Intravenous  PONV Risk Score and Plan: 3 and Dexamethasone , Ondansetron  and Treatment may vary due to age or medical condition  Airway Management Planned: Oral ETT  Additional Equipment: None  Intra-op Plan:   Post-operative Plan: Extubation in OR  Informed Consent: I have reviewed the patients History and Physical, chart, labs and discussed the procedure including the risks, benefits and alternatives for the proposed anesthesia with the patient or authorized representative who has indicated his/her understanding and acceptance.     Dental advisory given  Plan Discussed with: CRNA  Anesthesia Plan Comments:         Anesthesia Quick Evaluation  "

## 2024-03-14 NOTE — Progress Notes (Signed)
Assisted Dr. Rob Fitzgerald with right, interscalene , ultrasound guided block. Side rails up, monitors on throughout procedure. See vital signs in flow sheet. Tolerated Procedure well. 

## 2024-03-14 NOTE — Anesthesia Procedure Notes (Signed)
 Anesthesia Regional Block: Interscalene brachial plexus block   Pre-Anesthetic Checklist: , timeout performed,  Correct Patient, Correct Site, Correct Laterality,  Correct Procedure, Correct Position, site marked,  Risks and benefits discussed,  Surgical consent,  Pre-op evaluation,  At surgeon's request and post-op pain management  Laterality: Right  Prep: chloraprep       Needles:  Injection technique: Single-shot  Needle Type: Echogenic Needle     Needle Length: 9cm  Needle Gauge: 21     Additional Needles:   Procedures:,,,, ultrasound used (permanent image in chart),,    Narrative:  Start time: 03/14/2024 7:21 AM End time: 03/14/2024 7:28 AM Injection made incrementally with aspirations every 5 mL.  Performed by: Personally  Anesthesiologist: Epifanio Charleston, MD

## 2024-03-14 NOTE — Transfer of Care (Signed)
 Immediate Anesthesia Transfer of Care Note  Patient: Candice Oliver  Procedure(s) Performed: ARTHROPLASTY, SHOULDER, TOTAL, REVERSE (Right: Shoulder)  Patient Location: PACU  Anesthesia Type:General and Regional  Level of Consciousness: awake, alert , and patient cooperative  Airway & Oxygen Therapy: Patient Spontanous Breathing and Patient connected to face mask oxygen  Post-op Assessment: Report given to RN and Post -op Vital signs reviewed and stable  Post vital signs: Reviewed and stable  Last Vitals:  Vitals Value Taken Time  BP 169/77 03/14/24 09:38  Temp    Pulse 77 03/14/24 09:39  Resp 21 03/14/24 09:39  SpO2 100 % 03/14/24 09:39  Vitals shown include unfiled device data.  Last Pain:  Vitals:   03/14/24 0637  TempSrc: Temporal  PainSc: 0-No pain      Patients Stated Pain Goal: 3 (03/14/24 9362)  Complications: No notable events documented.

## 2024-03-15 ENCOUNTER — Encounter (HOSPITAL_BASED_OUTPATIENT_CLINIC_OR_DEPARTMENT_OTHER): Payer: Self-pay | Admitting: Orthopaedic Surgery

## 2024-03-15 NOTE — Anesthesia Postprocedure Evaluation (Signed)
"   Anesthesia Post Note  Patient: Candice Oliver  Procedure(s) Performed: ARTHROPLASTY, SHOULDER, TOTAL, REVERSE (Right: Shoulder)     Patient location during evaluation: PACU Anesthesia Type: General Level of consciousness: awake and alert Pain management: pain level controlled Vital Signs Assessment: post-procedure vital signs reviewed and stable Respiratory status: spontaneous breathing, nonlabored ventilation, respiratory function stable and patient connected to nasal cannula oxygen Cardiovascular status: blood pressure returned to baseline and stable Postop Assessment: no apparent nausea or vomiting Anesthetic complications: no   No notable events documented.  Last Vitals:  Vitals:   03/14/24 1030 03/14/24 1115  BP: (!) 146/68 132/64  Pulse: 70 65  Resp: 15 16  Temp:  (!) 36.2 C  SpO2: 99% 96%    Last Pain:  Vitals:   03/14/24 1115  TempSrc:   PainSc: 0-No pain                 Epifanio Lamar BRAVO      "
